# Patient Record
Sex: Female | Born: 1975 | Race: Black or African American | Hispanic: No | Marital: Married | Smoking: Never smoker
Health system: Southern US, Community
[De-identification: ages and names within clinical notes are randomized; demographics above are authoritative.]

## PROBLEM LIST (undated history)

## (undated) DIAGNOSIS — I10 Essential (primary) hypertension: Secondary | ICD-10-CM

---

## 1997-06-28 ENCOUNTER — Inpatient Hospital Stay (HOSPITAL_COMMUNITY): Admission: AD | Admit: 1997-06-28 | Discharge: 1997-06-28 | Payer: Self-pay | Admitting: *Deleted

## 1997-09-14 ENCOUNTER — Other Ambulatory Visit: Admission: RE | Admit: 1997-09-14 | Discharge: 1997-09-14 | Payer: Self-pay | Admitting: Obstetrics and Gynecology

## 1998-03-02 ENCOUNTER — Observation Stay (HOSPITAL_COMMUNITY): Admission: AD | Admit: 1998-03-02 | Discharge: 1998-03-02 | Payer: Self-pay | Admitting: Obstetrics and Gynecology

## 1998-03-14 ENCOUNTER — Inpatient Hospital Stay (HOSPITAL_COMMUNITY): Admission: AD | Admit: 1998-03-14 | Discharge: 1998-03-14 | Payer: Self-pay | Admitting: Obstetrics and Gynecology

## 1998-03-25 ENCOUNTER — Inpatient Hospital Stay (HOSPITAL_COMMUNITY): Admission: AD | Admit: 1998-03-25 | Discharge: 1998-03-27 | Payer: Self-pay | Admitting: Obstetrics & Gynecology

## 1998-09-19 ENCOUNTER — Emergency Department (HOSPITAL_COMMUNITY): Admission: EM | Admit: 1998-09-19 | Discharge: 1998-09-19 | Payer: Self-pay | Admitting: Emergency Medicine

## 2000-03-24 ENCOUNTER — Emergency Department (HOSPITAL_COMMUNITY): Admission: EM | Admit: 2000-03-24 | Discharge: 2000-03-24 | Payer: Self-pay | Admitting: *Deleted

## 2000-08-09 ENCOUNTER — Emergency Department (HOSPITAL_COMMUNITY): Admission: EM | Admit: 2000-08-09 | Discharge: 2000-08-09 | Payer: Self-pay

## 2000-08-17 ENCOUNTER — Other Ambulatory Visit: Admission: RE | Admit: 2000-08-17 | Discharge: 2000-08-17 | Payer: Self-pay | Admitting: Obstetrics

## 2000-08-17 ENCOUNTER — Encounter (INDEPENDENT_AMBULATORY_CARE_PROVIDER_SITE_OTHER): Payer: Self-pay

## 2000-09-14 ENCOUNTER — Emergency Department (HOSPITAL_COMMUNITY): Admission: EM | Admit: 2000-09-14 | Discharge: 2000-09-14 | Payer: Self-pay | Admitting: Emergency Medicine

## 2000-11-23 ENCOUNTER — Encounter: Admission: RE | Admit: 2000-11-23 | Discharge: 2000-11-23 | Payer: Self-pay | Admitting: Obstetrics & Gynecology

## 2003-07-13 ENCOUNTER — Inpatient Hospital Stay (HOSPITAL_COMMUNITY): Admission: AD | Admit: 2003-07-13 | Discharge: 2003-07-13 | Payer: Self-pay | Admitting: Obstetrics and Gynecology

## 2003-07-20 ENCOUNTER — Inpatient Hospital Stay (HOSPITAL_COMMUNITY): Admission: AD | Admit: 2003-07-20 | Discharge: 2003-07-20 | Payer: Self-pay | Admitting: Obstetrics and Gynecology

## 2003-10-30 ENCOUNTER — Other Ambulatory Visit: Admission: RE | Admit: 2003-10-30 | Discharge: 2003-10-30 | Payer: Self-pay | Admitting: Obstetrics and Gynecology

## 2004-02-22 ENCOUNTER — Inpatient Hospital Stay (HOSPITAL_COMMUNITY): Admission: AD | Admit: 2004-02-22 | Discharge: 2004-02-22 | Payer: Self-pay | Admitting: Obstetrics and Gynecology

## 2004-03-02 ENCOUNTER — Ambulatory Visit (HOSPITAL_COMMUNITY): Admission: RE | Admit: 2004-03-02 | Discharge: 2004-03-02 | Payer: Self-pay | Admitting: Obstetrics and Gynecology

## 2004-04-04 ENCOUNTER — Inpatient Hospital Stay (HOSPITAL_COMMUNITY): Admission: AD | Admit: 2004-04-04 | Discharge: 2004-04-04 | Payer: Self-pay | Admitting: Obstetrics and Gynecology

## 2004-05-09 ENCOUNTER — Inpatient Hospital Stay (HOSPITAL_COMMUNITY): Admission: AD | Admit: 2004-05-09 | Discharge: 2004-05-11 | Payer: Self-pay | Admitting: Obstetrics and Gynecology

## 2004-05-10 ENCOUNTER — Encounter (INDEPENDENT_AMBULATORY_CARE_PROVIDER_SITE_OTHER): Payer: Self-pay | Admitting: Specialist

## 2008-07-02 ENCOUNTER — Inpatient Hospital Stay (HOSPITAL_COMMUNITY): Admission: AD | Admit: 2008-07-02 | Discharge: 2008-07-03 | Payer: Self-pay | Admitting: Obstetrics and Gynecology

## 2008-07-02 ENCOUNTER — Ambulatory Visit: Payer: Self-pay | Admitting: Physician Assistant

## 2009-01-03 ENCOUNTER — Inpatient Hospital Stay (HOSPITAL_COMMUNITY): Admission: AD | Admit: 2009-01-03 | Discharge: 2009-01-03 | Payer: Self-pay | Admitting: Obstetrics and Gynecology

## 2009-01-03 ENCOUNTER — Ambulatory Visit: Payer: Self-pay | Admitting: Physician Assistant

## 2010-04-05 ENCOUNTER — Emergency Department (HOSPITAL_COMMUNITY)
Admission: EM | Admit: 2010-04-05 | Discharge: 2010-04-06 | Disposition: A | Discharge status: Home / Self Care | Payer: Self-pay | Attending: Emergency Medicine | Admitting: Emergency Medicine

## 2010-04-05 ENCOUNTER — Emergency Department (HOSPITAL_COMMUNITY): Payer: Self-pay

## 2010-04-05 DIAGNOSIS — I10 Essential (primary) hypertension: Secondary | ICD-10-CM | POA: Insufficient documentation

## 2010-04-05 DIAGNOSIS — R51 Headache: Secondary | ICD-10-CM | POA: Insufficient documentation

## 2010-04-05 LAB — DIFFERENTIAL
Basophils Absolute: 0 10*3/uL (ref 0.0–0.1)
Basophils Relative: 1 % (ref 0–1)
Eosinophils Relative: 3 % (ref 0–5)
Monocytes Relative: 8 % (ref 3–12)
Neutro Abs: 2.9 10*3/uL (ref 1.7–7.7)
Neutrophils Relative %: 44 % (ref 43–77)

## 2010-04-05 LAB — COMPREHENSIVE METABOLIC PANEL
ALT: 14 U/L (ref 0–35)
AST: 17 U/L (ref 0–37)
GFR calc non Af Amer: >60 mL/min (ref >60)
Glucose, Bld: 109 mg/dL — ABNORMAL HIGH (ref 70–99)
Potassium: 3.9 mEq/L (ref 3.5–5.1)
Sodium: 135 mEq/L (ref 135–145)
Total Protein: 7.6 g/dL (ref 6.0–8.3)

## 2010-04-05 LAB — POCT PREGNANCY, URINE: Preg Test, Ur: NEGATIVE

## 2010-04-05 LAB — CBC
Hemoglobin: 12.4 g/dL (ref 12.0–15.0)
MCH: 28.9 pg (ref 26.0–34.0)
MCHC: 32.6 g/dL (ref 30.0–36.0)
MCV: 88.6 fL (ref 78.0–100.0)

## 2010-04-06 LAB — URINALYSIS, ROUTINE W REFLEX MICROSCOPIC
Ketones, ur: NEGATIVE mg/dL
Protein, ur: NEGATIVE mg/dL
Specific Gravity, Urine: 1.019 (ref 1.005–1.030)
pH: 6 (ref 5.0–8.0)

## 2010-04-06 LAB — URINE MICROSCOPIC-ADD ON

## 2010-04-08 LAB — URINE CULTURE: Culture  Setup Time: 201203070911

## 2010-05-09 LAB — GC/CHLAMYDIA PROBE AMP, GENITAL: GC Probe Amp, Genital: NEGATIVE

## 2010-05-09 LAB — URINALYSIS, ROUTINE W REFLEX MICROSCOPIC
Glucose, UA: NEGATIVE mg/dL
Ketones, ur: NEGATIVE mg/dL
Leukocytes, UA: NEGATIVE
Nitrite: POSITIVE — AB
Urobilinogen, UA: 0.2 mg/dL (ref 0.0–1.0)

## 2010-05-09 LAB — RAPID URINE DRUG SCREEN, HOSP PERFORMED
Amphetamines: NOT DETECTED
Benzodiazepines: NOT DETECTED
Cocaine: NOT DETECTED

## 2010-05-09 LAB — URINE MICROSCOPIC-ADD ON

## 2010-05-09 LAB — CBC
Hemoglobin: 12.1 g/dL (ref 12.0–15.0)
MCHC: 33.9 g/dL (ref 30.0–36.0)
RBC: 3.83 MIL/uL — ABNORMAL LOW (ref 3.87–5.11)
RDW: 12.5 % (ref 11.5–15.5)

## 2010-05-09 LAB — COMPREHENSIVE METABOLIC PANEL
AST: 17 U/L (ref 0–37)
Alkaline Phosphatase: 40 U/L (ref 39–117)
BUN: 7 mg/dL (ref 6–23)
Calcium: 9.3 mg/dL (ref 8.4–10.5)
GFR calc Af Amer: >60 mL/min (ref >60)
GFR calc non Af Amer: >60 mL/min (ref >60)

## 2010-05-09 LAB — URINE CULTURE

## 2010-05-09 LAB — WET PREP, GENITAL

## 2010-06-17 NOTE — Discharge Summary (Signed)
NAMECOHEN, DOLEMAN                 ACCOUNT NO.:  [id number]   MEDICAL RECORD NO.:  [id number]          PATIENT TYPE:  INP   LOCATION:  9129                          FACILITY:  [facility]   PHYSICIAN:  Naima A. Dillard, M.D. DATE OF BIRTH:  11/19/1975   DATE OF ADMISSION:  05/09/2004  DATE OF DISCHARGE:                                 DISCHARGE SUMMARY   ADMITTING DIAGNOSES:  1.  Intrauterine pregnancy at 37 and two-sevenths weeks.  2.  Chronic hypertension with increasing blood pressures.  3.  Favorable cervix.  4.  Positive group B strep.  5.  Desires tubal sterilization.   DISCHARGE DIAGNOSES:  1.  Term pregnancy.  2.  Chronic hypertension.  3.  Desired tubal sterilization.   PROCEDURES:  1.  Normal spontaneous vaginal birth.  2.  Postpartum bilateral tubal ligation.  3.  General anesthesia.   HOSPITAL COURSE:  Catherine Parsons is a 35 year old gravida 4 para 2-0-1-2 at 51 and  two-sevenths weeks who was admitted for induction on May 09, 2004  secondary to chronic hypertension with increasing blood pressures and  favorable cervix. Her history has been remarkable for:  1.  Positive ANA.  2.  History of rapid labor.  3.  History of chronic hypertension on Aldomet.  4.  Positive group B strep.  5.  Desired tubal sterilization.   On admission, cervix was 3-4 cm, her blood pressure was in the 150-160s over  90s, positive beta strep had been noted. She was admitted under Dr.  Debria Garret care. Artificial rupture of membranes was accomplished with clear  fluid noted. A scalp electrode was placed. PIH labs were within normal  limits. Urine protein was negative. She then progressed rapidly to delivery  at 9:26 p.m. on the evening of May 09, 2004. She had a viable female by  the name of Catherine Parsons, weight 8 pounds, Apgars were 8 and 9. There were no  lacerations. The patient tolerated the procedure well. She did elect a tubal  sterilization and this was planned for the following day. During  the night  she did have one episode of moderate to heavy bleeding. Pitocin was added to  the IV. This then resolved itself. By postpartum day #1 the patient was  doing well. She was awaiting a tubal at 12 p.m. She was bottle feeding. Her  blood pressure over the night had been 143/94 and 150/103. By the morning of  April 11 it was 128/83. Hemoglobin was 9.1, down from 10.6, white blood cell  count 11.6, platelet count was 268. Her physical exam was within normal  limits. Her fundus was firm and her bleeding was scant to moderate. She was  taken to the operating room where Dr. Osborn Coho on May 10, 2004  performed a tubal sterilization per the patient's request under general  anesthesia. The patient tolerated the procedure well, there was minimal  blood loss, and the patient was sent to recovery in good condition. By  postpartum day #2, postoperative day #1, the patient was continuing to do  well. She had had an isolated event of  elevated blood pressure during the  night. She was begun on Procardia XL 30 mg. Her initial blood pressure prior  to the Procardia was 176/104 at 3:45 a.m. Subsequent blood pressures had  been in the 120s to 130s over 80 to 85. By the morning of May 11, 2004 she  was feeling well. She was up ad lib, tolerating a regular diet. She was  deemed to have received the full benefit of her hospital stay. Her incision  was clean, dry, and intact and she was discharged home.   DISCHARGE INSTRUCTIONS:  Per [facility] handout. PIH precautions  were reviewed.   DISCHARGE MEDICATIONS:  1.  Motrin 600 mg p.o. q.6h. p.r.n. pain.  2.  Tylox one to two p.o. q.3-4h. p.r.n. pain.  3.  Procardia XL 30 mg one p.o. daily with the patient instructed to take in      the evening since that is when it was initiated.   DISCHARGE FOLLOW-UP:  Will occur in 1 week at [facility] for blood  pressure check and p.r.n. at 6 weeks.      VLL/MEDQ  D:  05/11/2004  T:   05/11/2004  Job:  [id number]

## 2010-06-17 NOTE — Op Note (Signed)
Catherine Parsons, Catherine Parsons                 ACCOUNT NO.:  [id number]   MEDICAL RECORD NO.:  [id number]          PATIENT TYPE:  INP   LOCATION:  9129                          FACILITY:  [facility]   PHYSICIAN:  Osborn Coho, M.D.   DATE OF BIRTH:  1975/06/16   DATE OF PROCEDURE:  05/10/2004  DATE OF DISCHARGE:                                 OPERATIVE REPORT   PREOPERATIVE DIAGNOSES:  1.  Multiparity.  2.  Permanent sterilization.  3.  Status post normal spontaneous vaginal delivery.   POSTOPERATIVE DIAGNOSES:  1.  Multiparity.  2.  Permanent sterilization.  3.  Status post normal spontaneous vaginal delivery.   PROCEDURE:  Postpartum bilateral tubal ligation.   ANESTHESIA:  General.   ATTENDING:  Osborn Coho, M.D.   FLUIDS:  1600 mL.   URINE OUTPUT:  Voided prior to procedure.   ESTIMATED BLOOD LOSS:  Minimal, less than 50 mL.   COMPLICATIONS:  None.   PATHOLOGY:  Bilateral portions of fallopian tubes.   PROCEDURE:  The patient was taken to the operating room after the risks,  benefits, and alternatives reviewed with the patient, patient verbalized  understanding and consent signed and witnessed.  The patient was placed  under general anesthesia and prepped and draped in the normal sterile  fashion.  An approximately 10 mm incision was made at the umbilicus and  carried down to the underlying layer of fascia, which was then excised with  the Mayo scissors.  The peritoneum was then entered bluntly and the left  fallopian tube identified and carried out to its fimbriated end.  The tube  was grasped in its isthmic portion and ligated x2 with 0 plain.  The tube  was then excised and sent to pathology.  The remaining stump was cauterized  with the Bovie.  The same was done with the right fallopian tube after  carrying out to its fimbriated end.  The left fallopian tube was visualized  once again and  hemostasis noted.  The fascia was repaired with 0 Vicryl in a running  fashion.   The skin was closed with 3-0 Monocryl using a subcuticular stitch.  Sponge, lap and needle count was correct.  The patient tolerated the  procedure well and was returned to the recovery room in good condition.      AR/MEDQ  D:  05/10/2004  T:  05/10/2004  Job:  [id number]

## 2010-06-17 NOTE — H&P (Signed)
Catherine Parsons, Catherine Parsons                 ACCOUNT NO.:  [id number]   MEDICAL RECORD NO.:  [id number]          PATIENT TYPE:  INP   LOCATION:  9163                          FACILITY:  [facility]   PHYSICIAN:  Janine Limbo, M.D.DATE OF BIRTH:  09-06-1975   DATE OF ADMISSION:  05/09/2004  DATE OF DISCHARGE:                                HISTORY & PHYSICAL   Catherine Parsons is a 35 year old single black female, gravida 4, para 2-0-1-2, at  37-2/7 weeks who is being admitted for induction of labor secondary to  increasing blood pressures at term with favorable cervix.  She has a history  of chronic hypertension and has been on Aldomet 500 mg p.o. t.i.d.  At her  routine visit today, was noted to have increasing blood pressure and a  favorable cervix.  She denies any headaches, nausea, vomiting, or visual  disturbances.  Her most recent ultrasound on April 25, 2004, showed an  estimated fetal weight of 6 pounds 9 ounces.  Her pregnancy has been  followed at [facility] by the MD service and has been at risk  for 1) positive ANA, 2) history of rapid labor, 3) history of chronic  hypertension on Aldomet, now increasing, and 4) positive group B strep.   OB-GYN HISTORY:  She is a gravida 4, para 2-0-1-2, who delivered a viable  female infant in January 1995 who weighed 7 pounds 6 ounces at [redacted] weeks  gestation following a 4-hour labor without complications.  In February 2000,  she delivered a viable female infant who weighed 7 pounds 8 ounces at [redacted]  weeks gestation following a 4-hour labor, and she did have postpartum  hemorrhage with that delivery.  In June 2005, she had a miscarriage without  complications.   ALLERGIES:  no known drug allergies.   PAST MEDICAL HISTORY:  She reports having the usual childhood diseases.  She  reports her only surgery was wisdom teeth, and she has no other medical  issues.   FAMILY HISTORY:  Significant for mother and grandmother with chronic  hypertension,  maternal grandmother with diabetes.  Genetic history is  negative.   SOCIAL HISTORY:  She is single.  The father of the baby is [place],  and he is involved and supportive.  She is full-time homemaker.  He is  employed full time.  They deny any religious affiliation that might affect  their care.   PRENATAL LABORATORY DATA:  Blood type O positive.  Antibody negative.  Sickle cell trait negative.  Syphilis nonreactive.  Rubella positive.  Hepatitis B surface antigen is negative.  HIV is nonreactive.  GC and  chlamydia are both negative.  Her one-hour Glucola was within normal limits.   PHYSICAL EXAMINATION:  VITAL SIGNS:  Blood pressure 150s to 160s/90s.  She  is afebrile.  HEENT:  Grossly within normal limits.  HEART:  Regular rhythm and rate.  CHEST:  Clear.  BREASTS:  Soft and nontender.  ABDOMEN:  Gravid with irregular, mild uterine contractions.  Fetal heart  rate is in the 150s.  PELVIC:  Cervix is 3  to 4 cm per Dr. Su Hilt at the office.  EXTREMITIES:  Within normal limits.   ASSESSMENT:  1.  Intrauterine pregnancy at term.  2.  Chronic hypertension with increasing blood pressure.  3.  Favorable cervix.  4.  Positive group b Streptococcus.   PLAN:  Per consult with Dr. Leonard Schwartz, admit for induction of  labor today, and the patient is in agreement.  Will plan direct admission to  labor and delivery per Dr. Stefano Gaul.      SJD/MEDQ  D:  05/09/2004  T:  05/09/2004  Job:  [id number]

## 2011-08-07 ENCOUNTER — Emergency Department (HOSPITAL_COMMUNITY)
Admission: EM | Admit: 2011-08-07 | Discharge: 2011-08-08 | Disposition: A | Discharge status: Home / Self Care | Payer: Self-pay | Attending: Emergency Medicine | Admitting: Emergency Medicine

## 2011-08-07 ENCOUNTER — Emergency Department (HOSPITAL_COMMUNITY): Payer: Self-pay

## 2011-08-07 ENCOUNTER — Encounter (HOSPITAL_COMMUNITY): Payer: Self-pay | Admitting: *Deleted

## 2011-08-07 DIAGNOSIS — I10 Essential (primary) hypertension: Secondary | ICD-10-CM | POA: Insufficient documentation

## 2011-08-07 DIAGNOSIS — M7989 Other specified soft tissue disorders: Secondary | ICD-10-CM | POA: Insufficient documentation

## 2011-08-07 DIAGNOSIS — M25569 Pain in unspecified knee: Secondary | ICD-10-CM | POA: Insufficient documentation

## 2011-08-07 NOTE — ED Notes (Signed)
Pt states that she has high BP but that she has not taken her medications "in awhile". Pt states that she feels this may be why her leg is swollen.

## 2011-08-07 NOTE — ED Notes (Signed)
Rt knee pain isnce Saturday.  No known injury

## 2011-08-07 NOTE — ED Notes (Signed)
NP notified of pt BP.  

## 2011-08-08 LAB — POCT I-STAT, CHEM 8
BUN: 11 mg/dL (ref 6–23)
Creatinine, Ser: 0.6 mg/dL (ref 0.50–1.10)
Potassium: 3.9 mEq/L (ref 3.5–5.1)
Sodium: 139 mEq/L (ref 135–145)

## 2011-08-08 MED ORDER — AMLODIPINE BESYLATE 5 MG PO TABS: 5.0000 mg | ORAL_TABLET | Freq: Once | ORAL | Status: DC

## 2011-08-08 MED ORDER — AMLODIPINE BESYLATE 5 MG PO TABS
5.0000 mg | ORAL_TABLET | Freq: Once | ORAL | Status: AC
  Administered 2011-08-08: 5 mg via ORAL
  Filled 2011-08-08: qty 1

## 2011-08-08 NOTE — ED Notes (Signed)
CDU RN called ok to send pt over to her dept.

## 2011-08-08 NOTE — ED Provider Notes (Signed)
History     CSN: [id number]  Arrival date & time 08/07/11  2225   None     Chief Complaint  Patient presents with  . Knee Pain    (Consider location/radiation/quality/duration/timing/severity/associated sxs/prior treatment) HPI Comments: States she's had right knee discomfort for the past 2, days.  She does not recall any injury, twisting falling.  Previous injury to the knee.  She states it feels swollen in the posterior knee, behind her kneecap chest not taking any over-the-counter medications it is also noted that she is quite hypertensive.  On arrival in the emergency department.  On questioning her further she stopped taking her blood pressure medicine.  Several months ago, because she ran out, and she didn't find a new primary care Dr. unfortunately, she cannot remember what medication.  She was taking.  Patient is a 36 y.o. female presenting with knee pain. The history is provided by the patient.  Knee Pain This is a new problem. The current episode started in the past 7 days. The problem has been unchanged. Pertinent negatives include no headaches, joint swelling, numbness or rash.    History reviewed. No pertinent past medical history.  History reviewed. No pertinent past surgical history.  No family history on file.  History  Substance Use Topics  . Smoking status: Never Smoker   . Smokeless tobacco: Not on file  . Alcohol Use: No    OB History    Grav Para Term Preterm Abortions TAB SAB Ect Mult Living                  Review of Systems  Eyes: Negative for visual disturbance.  Cardiovascular: Positive for leg swelling.  Musculoskeletal: Negative for joint swelling.  Skin: Negative for rash and wound.  Neurological: Negative for dizziness, numbness and headaches.    Allergies  Review of patient's allergies indicates no known allergies.  Home Medications   Current Outpatient Rx  Name Route Sig Dispense Refill  . NAPROXEN SODIUM 220 MG PO TABS Oral Take  220 mg by mouth daily as needed. For pain    . AMLODIPINE BESYLATE 5 MG PO TABS Oral Take 1 tablet (5 mg total) by mouth once. 60 tablet 0    BP 170/101  Pulse 88  Temp 98.9 F (37.2 C) (Oral)  Resp 18  SpO2 99%  Physical Exam  Constitutional: She appears well-developed and well-nourished.  HENT:  Head: Normocephalic.  Eyes: Pupils are equal, round, and reactive to light.  Neck: Normal range of motion.  Cardiovascular: Normal rate.   Pulmonary/Chest: Effort normal.  Musculoskeletal: Normal range of motion.       Is minimal swelling to the medial right knee.  Negative Homans sign.  Strong pedal pulses, color, and temperature is equivalent bilaterally, there and the skin, I cannot reproduce pain with manipulation.  Patient states it only hurts when she is ambulatory  Neurological: She is alert.  Skin: Skin is warm. No rash noted. No erythema.    ED Course  Procedures (including critical care time)  Labs Reviewed  POCT I-STAT, CHEM 8 - Abnormal; Notable for the following:    Calcium, Ion 1.52 (*)     Hemoglobin 11.9 (*)     HCT 35.0 (*)     All other components within normal limits   Dg Knee Complete 4 Views Right  08/08/2011  *RADIOLOGY REPORT*  Clinical Data: Pain  RIGHT KNEE - COMPLETE 4+ VIEW  Comparison: None.  Findings: No acute fracture and  no dislocation.  Small joint effusion.  IMPRESSION: No acute bony injury.  Small joint effusion.  Original Report Authenticated By: Donavan Burnet, M.D.     1. Knee pain   2. Hypertension     ED ECG REPORT   Date: 08/08/2011  EKG Time: 3:14 AM  Rate: 62  Rhythm: normal sinus rhythm,  there are no previous tracings available for comparison  Axis: normal  Intervals:none  ST&T Change: none  Narrative Interpretation: normal            MDM   Will place patient in knee sleeve for comfort advise her to continue taking her Naprosyn on a regular basis.  I will evaluate EKG, and I stat for end organ damage to to her  persistently untreated, hypertension EKG and I-sat are normal will DC patient home with Rx for Norvacs adn referral to establish care with [facility]         Arman Filter, NP 08/08/11 7829  Arman Filter, NP 08/08/11 5621  Arman Filter, NP 08/08/11 [phone]

## 2011-08-08 NOTE — ED Notes (Signed)
12 lead EKG completed by this Clinical research associate and given to Sharen Hones NP

## 2011-08-08 NOTE — ED Provider Notes (Signed)
Medical screening examination/treatment/procedure(s) were performed by non-physician practitioner and as supervising physician I was immediately available for consultation/collaboration.    Vida Roller, MD 08/08/11 [phone]

## 2013-03-07 ENCOUNTER — Encounter (HOSPITAL_COMMUNITY): Payer: Self-pay | Admitting: Emergency Medicine

## 2013-03-07 ENCOUNTER — Emergency Department (HOSPITAL_COMMUNITY)
Admission: EM | Admit: 2013-03-07 | Discharge: 2013-03-08 | Disposition: A | Discharge status: Home / Self Care | Payer: 59 | Attending: Emergency Medicine | Admitting: Emergency Medicine

## 2013-03-07 DIAGNOSIS — J3489 Other specified disorders of nose and nasal sinuses: Secondary | ICD-10-CM | POA: Insufficient documentation

## 2013-03-07 DIAGNOSIS — I10 Essential (primary) hypertension: Secondary | ICD-10-CM

## 2013-03-07 DIAGNOSIS — R509 Fever, unspecified: Secondary | ICD-10-CM | POA: Insufficient documentation

## 2013-03-07 DIAGNOSIS — R000 Tachycardia, unspecified: Secondary | ICD-10-CM | POA: Insufficient documentation

## 2013-03-07 DIAGNOSIS — R52 Pain, unspecified: Secondary | ICD-10-CM | POA: Insufficient documentation

## 2013-03-07 DIAGNOSIS — IMO0001 Reserved for inherently not codable concepts without codable children: Secondary | ICD-10-CM | POA: Insufficient documentation

## 2013-03-07 DIAGNOSIS — Z79899 Other long term (current) drug therapy: Secondary | ICD-10-CM | POA: Insufficient documentation

## 2013-03-07 DIAGNOSIS — R6889 Other general symptoms and signs: Secondary | ICD-10-CM

## 2013-03-07 HISTORY — DX: Essential (primary) hypertension: I10

## 2013-03-07 MED ORDER — SODIUM CHLORIDE 0.9 % IV BOLUS (SEPSIS)
1000.0000 mL | Freq: Once | INTRAVENOUS | Status: AC
  Administered 2013-03-08: 1000 mL via INTRAVENOUS

## 2013-03-07 MED ORDER — AMLODIPINE BESYLATE 5 MG PO TABS
5.0000 mg | ORAL_TABLET | Freq: Once | ORAL | Status: AC
  Administered 2013-03-08: 5 mg via ORAL
  Filled 2013-03-07: qty 1

## 2013-03-07 NOTE — ED Provider Notes (Signed)
CSN: [id number]     Arrival date & time 03/07/13  2221 History   First MD Initiated Contact with Patient 03/07/13 2318     Chief Complaint  Patient presents with  . Generalized Body Aches  . Fever   (Consider location/radiation/quality/duration/timing/severity/associated sxs/prior Treatment) HPI Comments: Patient is a 38 year old female with a history of hypertension, currently off her blood pressure medication x8 months, who presents today for fever. Patient states that fever was preceded by nasal congestion and rhinorrhea this afternoon. She states that fever followed later in the day at 5:30PM. Tmax 102.79F. Patient took Aleve for symptoms with temporary relief of symptoms. She states she now feels "hot" and has diffuse myalgias. Myalgias most prominent across her upper back and in her b/l calves. She denies sore throat, cough, neck pain, chest pain, shortness of breath, abdominal pain, N/V, diarrhea, urinary symptoms, numbness/tingling, and weakness. Further denies hx of DVT/PE and ACS. 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   Patient is a 38 y.o. female presenting with fever. The history is provided by the patient. No language interpreter was used.  Fever Associated symptoms: congestion, myalgias and rhinorrhea   Associated symptoms: no chest pain, no cough, no diarrhea, no dysuria, no nausea, no rash, no sore throat and no vomiting     Past Medical History  Diagnosis Date  . Hypertension    History reviewed. No pertinent past surgical history. No family history on file. History  Substance Use Topics  . Smoking status: Never Smoker   . Smokeless tobacco: Not on file  . Alcohol Use: No   OB History   Grav Para Term Preterm Abortions TAB SAB Ect Mult Living                 Review of Systems  Constitutional: Positive for fever.  HENT: Positive for congestion and rhinorrhea. Negative for sore throat and trouble swallowing.   Respiratory: Negative  for cough and shortness of breath.   Cardiovascular: Negative for chest pain.  Gastrointestinal: Negative for nausea, vomiting and diarrhea.  Genitourinary: Negative for dysuria, hematuria, vaginal bleeding and vaginal discharge.  Musculoskeletal: Positive for myalgias.  Skin: Negative for rash.  Neurological: Negative for weakness and numbness.  All other systems reviewed and are negative.    Allergies  Review of patient's allergies indicates no known allergies.  Home Medications   Current Outpatient Rx  Name  Route  Sig  Dispense  Refill  . amLODipine (NORVASC) 10 MG tablet   Oral   Take 0.5 tablets (5 mg total) by mouth daily.   30 tablet   0   . oseltamivir (TAMIFLU) 75 MG capsule   Oral   Take 1 capsule (75 mg total) by mouth every 12 (twelve) hours.   10 capsule   0    BP 161/96  Pulse 109  Temp(Src) 100.4 F (38 C) (Rectal)  Resp 16  SpO2 100%  LMP 02/11/2013  Physical Exam  Nursing note and vitals reviewed. Constitutional: She is oriented to person, place, and time. She appears well-developed and well-nourished. No distress.  HENT:  Head: Normocephalic and atraumatic.  Right Ear: External ear normal. No mastoid tenderness.  Left Ear: External ear normal. No mastoid tenderness.  Nose: No rhinorrhea.  Mouth/Throat: Uvula is midline, oropharynx is clear and moist and mucous membranes are normal. No oropharyngeal exudate.  +Nasal congestion. Uvula midline and patient tolerating secretions without difficulty. Oropharynx clear.  Eyes: Conjunctivae and EOM are normal. Pupils are  equal, round, and reactive to light. No scleral icterus.  Neck: Normal range of motion. Neck supple.  No nuchal rigidity appreciated  Cardiovascular: Regular rhythm and normal heart sounds.  Tachycardia present.   HR 117bpm  Pulmonary/Chest: Effort normal and breath sounds normal. No stridor. No respiratory distress. She has no wheezes. She has no rales.  No tachypnea or dyspnea noted.  No retractions or accessory muscle use. Symmetric chest expansion.  Abdominal: Soft. She exhibits no distension. There is no tenderness. There is no rebound and no guarding.  Abdomen soft and nontender without peritoneal signs or masses.  Musculoskeletal: Normal range of motion.  Neurological: She is alert and oriented to person, place, and time.  Skin: Skin is warm and dry. No rash noted. She is not diaphoretic. No erythema. No pallor.  Psychiatric: She has a normal mood and affect. Her behavior is normal.    ED Course  Procedures (including critical care time) Labs Review Labs Reviewed - No data to display Imaging Review Dg Chest 2 View  03/08/2013   CLINICAL DATA:  38 year old female cough congestion and fever. Initial encounter.  EXAM: CHEST  2 VIEW  COMPARISON:  04/06/2010.  FINDINGS: Lung volumes are stable and within normal limits. Cardiac size is stable at the upper limits of normal. Other mediastinal contours are within normal limits. EKG leads and wires overlie the chest. The lungs are clear. No pneumothorax or effusion. No acute osseous abnormality identified.  IMPRESSION: No acute cardiopulmonary abnormality.   Electronically Signed   By: Augusto Gamble M.D.   On: 03/08/2013 01:00    EKG Interpretation    Date/Time:  Friday March 07 2013 22:31:23 EST Ventricular Rate:  131 PR Interval:  144 QRS Duration: 80 QT Interval:  298 QTC Calculation: 440 R Axis:   66 Text Interpretation:  Sinus tachycardia Otherwise normal ECG Since last tracing rate faster Confirmed by MILLER  MD, BRIAN ([id number]) on 03/08/2013 1:29:24 AM            MDM   1. Flu-like symptoms   2. Hypertension     Patient presents for fever in the presence of nasal congestion, rhinorrhea, and myalgias with onset this AM. Symptoms clinically c/w influenza like illness. Patient denies chest pain and shortness of breath; no N/V. She endorses improvement over ED course with IV fluids and Motrin. Patient tachycardic  on arrival; however, this is most likely secondary to fever and heartbeat trending down after patient given antipyretics. Doubt pulmonary embolism as patient without tachypnea, dyspnea, or hypoxia. Well's PE score 1.5 correlating with low risk of PE. Blood pressure trending down with Norvasc.  Patient hemodynamically stable and appropriate for discharge with prescription for Tamiflu. Will also prescribe Norvasc which patient has been noncompliant with secondary to lack of primary care followup. Supportive treatments advised and return precautions discussed. Patient agreeable to plan with no unaddressed concerns.   Filed Vitals:   03/08/13 0100 03/08/13 0200 03/08/13 0300 03/08/13 0302  BP: 164/106 158/105 140/89   Pulse: 110 106 110   Temp:    98.4 F (36.9 C)  TempSrc:    Oral  Resp: 17 19 18    SpO2: 100% 100% 99%      Antony Madura, PA-C 03/10/13 2000

## 2013-03-07 NOTE — ED Notes (Addendum)
C/o fever, body aches, and generalized weakness.  Denies nausea, vomiting, and diarrhea. HR elevated. States fever was 102.3 around 5:30 while at work. Took 1 Aleve at 7:30pm.  BP elevated- states she has not had blood pressure medication in approx 8 months due to no insurance at that time.

## 2013-03-08 ENCOUNTER — Emergency Department (HOSPITAL_COMMUNITY): Payer: 59

## 2013-03-08 MED ORDER — IBUPROFEN 800 MG PO TABS
800.0000 mg | ORAL_TABLET | Freq: Once | ORAL | Status: AC
  Administered 2013-03-08: 800 mg via ORAL
  Filled 2013-03-08: qty 1

## 2013-03-08 MED ORDER — AMLODIPINE BESYLATE 10 MG PO TABS: 5.0000 mg | ORAL_TABLET | Freq: Every day | ORAL | Status: DC

## 2013-03-08 MED ORDER — OSELTAMIVIR PHOSPHATE 75 MG PO CAPS: 75.0000 mg | ORAL_CAPSULE | Freq: Two times a day (BID) | ORAL | Status: DC

## 2013-03-08 MED ORDER — SODIUM CHLORIDE 0.9 % IV BOLUS (SEPSIS)
1000.0000 mL | Freq: Once | INTRAVENOUS | Status: AC
  Administered 2013-03-08: 1000 mL via INTRAVENOUS

## 2013-03-08 MED ORDER — OSELTAMIVIR PHOSPHATE 75 MG PO CAPS
75.0000 mg | ORAL_CAPSULE | Freq: Once | ORAL | Status: AC
  Administered 2013-03-08: 75 mg via ORAL
  Filled 2013-03-08: qty 1

## 2013-03-08 NOTE — Discharge Instructions (Signed)
Take Tamiflu as prescribed for symptoms. Also recommend you take ibuprofen 600 mg every 6 hours as needed for fever control and body aches. Use over-the-counter medications for your nasal congestion. You may also try saline sinus rinses. Followup with your primary care doctor regarding your symptoms. Return if symptoms worsen.  Influenza, Adult Influenza ("the flu") is a viral infection of the respiratory tract. It occurs more often in winter months because people spend more time in close contact with one another. Influenza can make you feel very sick. Influenza easily spreads from person to person (contagious). CAUSES  Influenza is caused by a virus that infects the respiratory tract. You can catch the virus by breathing in droplets from an infected person's cough or sneeze. You can also catch the virus by touching something that was recently contaminated with the virus and then touching your mouth, nose, or eyes. SYMPTOMS  Symptoms typically last 4 to 10 days and may include:  Fever.  Chills.  Headache, body aches, and muscle aches.  Sore throat.  Chest discomfort and cough.  Poor appetite.  Weakness or feeling tired.  Dizziness.  Nausea or vomiting. DIAGNOSIS  Diagnosis of influenza is often made based on your history and a physical exam. A nose or throat swab test can be done to confirm the diagnosis. RISKS AND COMPLICATIONS You may be at risk for a more severe case of influenza if you smoke cigarettes, have diabetes, have chronic heart disease (such as heart failure) or lung disease (such as asthma), or if you have a weakened immune system. Elderly people and pregnant women are also at risk for more serious infections. The most common complication of influenza is a lung infection (pneumonia). Sometimes, this complication can require emergency medical care and may be life-threatening. PREVENTION  An annual influenza vaccination (flu shot) is the best way to avoid getting influenza.  An annual flu shot is now routinely recommended for all adults in the [place] TREATMENT  In mild cases, influenza goes away on its own. Treatment is directed at relieving symptoms. For more severe cases, your caregiver may prescribe antiviral medicines to shorten the sickness. Antibiotic medicines are not effective, because the infection is caused by a virus, not by bacteria. HOME CARE INSTRUCTIONS  Only take over-the-counter or prescription medicines for pain, discomfort, or fever as directed by your caregiver.  Use a cool mist humidifier to make breathing easier.  Get plenty of rest until your temperature returns to normal. This usually takes 3 to 4 days.  Drink enough fluids to keep your urine clear or pale yellow.  Cover your mouth and nose when coughing or sneezing, and wash your hands well to avoid spreading the virus.  Stay home from work or school until your fever has been gone for at least 1 full day. SEEK MEDICAL CARE IF:   You have chest pain or a deep cough that worsens or produces more mucus.  You have nausea, vomiting, or diarrhea. SEEK IMMEDIATE MEDICAL CARE IF:   You have difficulty breathing, shortness of breath, or your skin or nails turn bluish.  You have severe neck pain or stiffness.  You have a severe headache, facial pain, or earache.  You have a worsening or recurring fever.  You have nausea or vomiting that cannot be controlled. MAKE SURE YOU:  Understand these instructions.  Will watch your condition.  Will get help right away if you are not doing well or get worse. Document Released: 01/14/2000 Document Revised: 07/18/2011 Document Reviewed:  04/17/2011 ExitCare Patient Information 2014 [place], [place].

## 2013-03-11 NOTE — ED Provider Notes (Signed)
Medical screening examination/treatment/procedure(s) were performed by non-physician practitioner and as supervising physician I was immediately available for consultation/collaboration.    Vida RollerBrian D Margarett Viti, MD 03/11/13 [phone]

## 2013-04-10 ENCOUNTER — Other Ambulatory Visit: Payer: Self-pay | Admitting: Physician Assistant

## 2013-04-10 ENCOUNTER — Other Ambulatory Visit (HOSPITAL_COMMUNITY)
Admission: RE | Admit: 2013-04-10 | Discharge: 2013-04-10 | Disposition: A | Discharge status: Home / Self Care | Payer: 59 | Source: Ambulatory Visit | Attending: Family Medicine | Admitting: Family Medicine

## 2013-04-10 DIAGNOSIS — Z124 Encounter for screening for malignant neoplasm of cervix: Secondary | ICD-10-CM | POA: Insufficient documentation

## 2013-04-29 ENCOUNTER — Other Ambulatory Visit: Payer: Self-pay | Admitting: Physician Assistant

## 2013-04-29 DIAGNOSIS — R748 Abnormal levels of other serum enzymes: Secondary | ICD-10-CM

## 2013-05-07 ENCOUNTER — Ambulatory Visit
Admission: RE | Admit: 2013-05-07 | Discharge: 2013-05-07 | Disposition: A | Discharge status: Home / Self Care | Payer: 59 | Source: Ambulatory Visit | Attending: Physician Assistant | Admitting: Physician Assistant

## 2013-05-07 DIAGNOSIS — R748 Abnormal levels of other serum enzymes: Secondary | ICD-10-CM

## 2013-10-24 ENCOUNTER — Encounter (HOSPITAL_COMMUNITY): Payer: Self-pay | Admitting: Emergency Medicine

## 2013-10-24 ENCOUNTER — Emergency Department (HOSPITAL_COMMUNITY)
Admission: EM | Admit: 2013-10-24 | Discharge: 2013-10-24 | Disposition: A | Discharge status: Home / Self Care | Payer: 59 | Attending: Emergency Medicine | Admitting: Emergency Medicine

## 2013-10-24 DIAGNOSIS — R51 Headache: Secondary | ICD-10-CM | POA: Insufficient documentation

## 2013-10-24 DIAGNOSIS — R519 Headache, unspecified: Secondary | ICD-10-CM

## 2013-10-24 DIAGNOSIS — Z79899 Other long term (current) drug therapy: Secondary | ICD-10-CM | POA: Insufficient documentation

## 2013-10-24 DIAGNOSIS — I10 Essential (primary) hypertension: Secondary | ICD-10-CM | POA: Insufficient documentation

## 2013-10-24 MED ORDER — DEXAMETHASONE SODIUM PHOSPHATE 10 MG/ML IJ SOLN
10.0000 mg | Freq: Once | INTRAMUSCULAR | Status: AC
  Administered 2013-10-24: 10 mg via INTRAVENOUS
  Filled 2013-10-24: qty 1

## 2013-10-24 MED ORDER — SODIUM CHLORIDE 0.9 % IV BOLUS (SEPSIS)
1000.0000 mL | Freq: Once | INTRAVENOUS | Status: AC
  Administered 2013-10-24: 1000 mL via INTRAVENOUS

## 2013-10-24 MED ORDER — DIPHENHYDRAMINE HCL 50 MG/ML IJ SOLN
12.5000 mg | Freq: Once | INTRAMUSCULAR | Status: AC
  Administered 2013-10-24: 12.5 mg via INTRAVENOUS
  Filled 2013-10-24: qty 1

## 2013-10-24 MED ORDER — PROCHLORPERAZINE EDISYLATE 5 MG/ML IJ SOLN
10.0000 mg | Freq: Four times a day (QID) | INTRAMUSCULAR | Status: DC | PRN
  Administered 2013-10-24: 10 mg via INTRAVENOUS
  Filled 2013-10-24: qty 2

## 2013-10-24 NOTE — ED Provider Notes (Signed)
CSN: [id number]     Arrival date & time 10/24/13  1834 History   First MD Initiated Contact with Patient 10/24/13 2013     Chief Complaint  Patient presents with  . Headache     (Consider location/radiation/quality/duration/timing/severity/associated sxs/prior Treatment) Patient is a 38 y.o. female presenting with headaches. The history is provided by the patient.  Headache Pain location:  R temporal Quality:  Sharp Radiates to:  Does not radiate Severity currently:  Unable to specify Severity at highest:  Unable to specify Onset quality:  Gradual Duration:  1 day Timing:  Constant Progression:  Waxing and waning Chronicity:  Recurrent (hx of similar HAs, she states she gets them sometimes when her BP is up or on her period. LMP 1 mo ago) Similar to prior headaches: yes   Context: not activity, not exposure to bright light and not loud noise   Relieved by: APAP. Worsened by:  Nothing tried Associated symptoms: no abdominal pain, no back pain, no blurred vision, no congestion, no cough, no diarrhea, no dizziness, no fatigue, no fever, no nausea, no near-syncope, no neck stiffness, no numbness, no photophobia, no syncope and no vomiting     Past Medical History  Diagnosis Date  . Hypertension    History reviewed. No pertinent past surgical history. No family history on file. History  Substance Use Topics  . Smoking status: Never Smoker   . Smokeless tobacco: Not on file  . Alcohol Use: No   OB History   Grav Para Term Preterm Abortions TAB SAB Ect Mult Living                 Review of Systems  Constitutional: Negative for fever, activity change, appetite change and fatigue.  HENT: Negative for congestion and rhinorrhea.   Eyes: Negative for blurred vision, photophobia, discharge, redness and itching.  Respiratory: Negative for cough, shortness of breath and wheezing.   Cardiovascular: Negative for chest pain, syncope and near-syncope.  Gastrointestinal: Negative for  nausea, vomiting, abdominal pain and diarrhea.  Genitourinary: Negative for dysuria and hematuria.  Musculoskeletal: Negative for back pain and neck stiffness.  Skin: Negative for rash and wound.  Neurological: Positive for headaches. Negative for dizziness, syncope and numbness.      Allergies  Review of patient's allergies indicates no known allergies.  Home Medications   Prior to Admission medications   Medication Sig Start Date End Date Taking? Authorizing Provider  acetaminophen (TYLENOL) 500 MG tablet Take 500 mg by mouth 2 (two) times daily as needed (pain).   Yes Historical Provider, MD  amLODipine (NORVASC) 10 MG tablet Take 10 mg by mouth daily.   Yes Historical Provider, MD  lisinopril (PRINIVIL,ZESTRIL) 10 MG tablet Take 10 mg by mouth at bedtime.  10/24/13  Yes Historical Provider, MD   BP 126/93  Pulse 64  Temp(Src) 98.3 F (36.8 C)  Resp 18  Ht  (1.651 m)  Wt 169 lb (76.658 kg)  BMI 28.12 kg/m2  SpO2 100%  LMP 09/26/2013 Physical Exam  Constitutional: She is oriented to person, place, and time. She appears well-developed and well-nourished. No distress.  HENT:  Head: Normocephalic and atraumatic.  Mouth/Throat: Oropharynx is clear and moist.  Eyes: Conjunctivae and EOM are normal. Pupils are equal, round, and reactive to light. Right eye exhibits no discharge. Left eye exhibits no discharge. No scleral icterus.  Neck: Normal range of motion. Neck supple.  Cardiovascular: Normal rate, regular rhythm and intact distal pulses.  Exam reveals no  gallop and no friction rub.   No murmur heard. Pulmonary/Chest: Effort normal and breath sounds normal. No respiratory distress. She has no wheezes. She has no rales.  Abdominal: Soft. She exhibits no distension and no mass. There is no tenderness.  Musculoskeletal: Normal range of motion.  Neurological: She is alert and oriented to person, place, and time. No cranial nerve deficit. She exhibits normal muscle tone.  Coordination normal.  CN II-XII intact, 5/5 strenght in all exts, normal sensation in all exts, F2N negative, ambulatory without ataxia  Skin: She is not diaphoretic.    ED Course  Procedures (including critical care time) Labs Review Labs Reviewed - No data to display  Imaging Review No results found.   EKG Interpretation None      MDM   MDM: 38 y.o. AAF w/ PMHx of HTN, w/ cc: HA. Pt states HA for a little over a day. States hurts on R. No photo/phonophobia. No n/v/d. States has had similar HAs past. Feels related to BP but doesn't normally check it. States has had normally with period. LMP 26 days ago, no active vag bleeding. Normally takes APAP for HA. No numbness, weakness. AFVSS, well appearing,. No neck rigidity, no fever, unlikely meningitis. No neuro deficits, BP not excessively elevated, HA c/w prior, unlikely SAH or other ICH. Pt given migraine cocktail, fluids, HA improved. Offered more meds or to go home and sleep it off and she elected to sleep it off. Discharged. Likely benign HA. Possibly related to her menstruation. Similar to prior, no concerning sxs. Discharged.   Final diagnoses:  Acute nonintractable headache, unspecified headache type   Discharge Medication List as of 10/24/2013 10:10 PM     [facility] EMERGENCY DEPARTMENT [address] [id number] [place] [address] [phone]  As needed  Discharged  Pilar Jarvis, MD 10/24/13 [phone]

## 2013-10-24 NOTE — ED Provider Notes (Signed)
I saw and evaluated the patient, reviewed the resident's note and I agree with the findings and plan.   .Face to face Exam:  General:  Awake HEENT:  Atraumatic Resp:  Normal effort Abd:  Nondistended Neuro:No focal weakness  Juquan Reznick L Elivia Robotham, MD 10/24/13 2359 

## 2013-10-24 NOTE — Discharge Instructions (Signed)

## 2013-10-24 NOTE — ED Notes (Signed)
The pt woke up this am with a terrible headache.  No nv.   She has taken tylenol that took the headache away but it came back.  lmp  Aug 20th

## 2013-10-24 NOTE — ED Notes (Signed)
Pt states "I can walk" rather than using wheelchair.

## 2013-11-11 ENCOUNTER — Ambulatory Visit (INDEPENDENT_AMBULATORY_CARE_PROVIDER_SITE_OTHER): Payer: 59 | Admitting: Internal Medicine

## 2013-11-11 ENCOUNTER — Encounter: Payer: Self-pay | Admitting: Internal Medicine

## 2013-11-11 DIAGNOSIS — R946 Abnormal results of thyroid function studies: Secondary | ICD-10-CM

## 2013-11-11 DIAGNOSIS — R7989 Other specified abnormal findings of blood chemistry: Secondary | ICD-10-CM

## 2013-11-11 NOTE — Patient Instructions (Signed)
Please stop at the lab. Please come back for a follow-up appointment in 3 months.  I will let you know if we need to check the urine collection now. Patient information (Up-to-Date): Collection of a 24-hour urine specimen  - You should collect every drop of urine during each 24-hour period. It does not matter how much or little urine is passed each time, as long as every drop is collected. - Begin the urine collection in the morning after you wake up, after you have emptied your bladder for the first time. - Urinate (empty the bladder) for the first time and flush it down the toilet. Note the exact time (eg, 6:15 AM). You will begin the urine collection at this time. - Collect every drop of urine during the day and night in an empty collection bottle. Store the bottle at room temperature or in the refrigerator. - If you need to have a bowel movement, any urine passed with the bowel movement should be collected. Try not to include feces with the urine collection. If feces does get mixed in, do not try to remove the feces from the urine collection bottle. - Finish by collecting the first urine passed the next morning, adding it to the collection bottle. This should be within ten minutes before or after the time of the first morning void on the first day (which was flushed). In this example, you would try to void between 6:05 and 6:25 on the second day. - If you need to urinate one hour before the final collection time, drink a full glass of water so that you can void again at the appropriate time. If you have to urinate 20 minutes before, try to hold the urine until the proper time. - Please note the exact time of the final collection, even if it is not the same time as when collection began on day 1. - The bottle(s) may be kept at room temperature for a day or two, but should be kept cool or refrigerated for longer periods of time.  Hypercalcemia Hypercalcemia means the calcium in your blood is too  high. Calcium in our blood is important for the control of many things, such as:  Blood clotting.  Conducting of nerve impulses.  Muscle contraction.  Maintaining teeth and bone health.  Other body functions. In the bloodstream, calcium maintains a constant balance with another mineral, phosphate. Calcium is absorbed into the body through the small intestine. This is helped by vitamin D. Calcium levels are maintained mostly by vitamin D and a hormone (parathyroid hormone). But the kidneys also help. Hypercalcemia can happen when the concentration of calcium is too high for the kidneys to maintain balance. The body maintains a balance between the calcium we eat and the calcium already in our body. If calcium intake is increased or we cannot use calcium properly, there may be problems. Some common sources of calcium are:   Dairy products.  Nuts.  Eggs.  Whole grains.  Legumes.  Green leafy vegetables. CAUSES There are many causes of this condition, but some common ones are:  Hyperparathyroidism. This is an overactivity of the parathyroid gland.  Cancers of the breast, kidney, lung, head, and neck are common causes of calcium increases.  Medications that cause you to urinate more often (diuretics), nausea, vomiting, and diarrhea also increase the calcium in the blood.  Overuse of calcium-containing antacids. SYMPTOMS  Many patients with mild hypercalcemia have no symptoms. For those with symptoms, common problems include:  Loss  of appetite.  Constipation.  Increased thirst.  Heart rhythm changes.  Abnormal thinking.  Nausea.  Abdominal pain.  Kidney stones.  Mood swings.  Coma and death when severe.  Vomiting.  Increased urination.  High blood pressure.  Confusion. DIAGNOSIS   Your caregiver will do a medical history and perform a physical exam on you.  Calcium and parathyroid hormone (PTH) may be measured with a blood test. TREATMENT   The  treatment depends on the calcium level and what is causing the higher level. Hypercalcemia can be life threatening. Fast lowering of the calcium level may be necessary.  With normal kidney function, fluids can be given by vein to clear the excess calcium. Hemodialysis works well to reduce dangerous calcium levels if there is poor kidney function. This is a procedure in which a machine is used to filter out unwanted substances. The blood is then returned to the body.  Drugs, such as diuretics, can be given after adequate fluid intake is established. These medications help the kidneys get rid of extra calcium. Drugs that lessen (inhibit) bone loss are helpful in gaining long-term control. Phosphate pills help lower high calcium levels caused by a low supply of phosphate. Anti-inflammatory agents such as steroids are helpful with some cancers and toxic levels of vitamin D.  Treatment of the underlying cause of the hypercalcemia will also correct the imbalance. Hyperparathyroidism is usually treated by surgical removal of one or more of the parathyroid glands and any tissue, other than the glands themselves, that is producing too much hormone.  The hypercalcemia caused by cancer is difficult to treat without controlling the cancer. Symptoms can be improved with fluids and drug therapy as outlined above. PROGNOSIS   Surgery to remove the parathyroid glands is usually successful. This also depends on the amount of damage to the kidneys and whether or not it can be treated.  Mild hypercalcemia can be controlled with good fluid intake and the use of effective medications.  Hypercalcemia often develops as a late complication of cancer. The expected outlook is poor without effective anticancer therapy. PREVENTION   If you are at risk for developing hypercalcemia, be familiar with early symptoms. Report these to your caregiver.  Good fluid intake (up to four quarts of liquid a day if possible) is  helpful.  Try to control nausea and vomiting, and treat fevers to avoid dehydration.  Lowering the amount of calcium in your diet is not necessary. High blood calcium reduces absorption of calcium in the intestine.  Stay as active as possible. SEEK IMMEDIATE MEDICAL CARE IF:   You develop chest pain, sweating, or shortness of breath.  You get confused, feel faint or pass out.  You develop severe nausea and vomiting. MAKE SURE YOU:   Understand these instructions.  Will watch your condition.  Will get help right away if you are not doing well or get worse. Document Released: 04/01/2004 Document Revised: 06/02/2013 Document Reviewed: 01/11/2010 [facility] Patient Information 2015 [place] Lake[employer], [place]. This information is not intended to replace advice given to you by your health care provider. Make sure you discuss any questions you have with your health care provider.

## 2013-11-11 NOTE — Progress Notes (Signed)
Patient ID: Catherine Parsons, female   DOB: 1975-10-06, 38 y.o.   MRN: [id number]   HPI  Catherine Parsons is a 38 y.o.-year-old female, referred by her PCP, REDMON,NOELLE, PA-C, in consultation for hypercalcemia/hyperparathyroidism and low TSH  Pt was dx with hypercalcemia in 03/2013. I reviewed pt's pertinent labs: 10/08/2013: PTH 68 10/02/2013: Ca 11.3 (8.6-10.3) 08/07/2013: iCa 1.52 (1.12-1.32) 04/12/2013: Ca 11.4 (8.6-10.3) 04/10/2013: Ca 11.1  Lab Results  Component Value Date   CALCIUM 10.5 04/05/2010   CALCIUM 9.3 07/02/2008   No fractures or falls.   No h/o kidney stones.  No h/o CKD. Last BUN/Cr: 12/0.65 on 03/12/2-15 Lab Results  Component Value Date   BUN 11 08/08/2011   CREATININE 0.60 08/08/2011   Pt is/was not on HCTZ.  No h/o vitamin D deficiency but no levels available.  Pt is not on calcium and vitamin D; she also eats dairy but not every day and some green, leafy, vegetables.  Pt does not have a FH of hypercalcemia, pituitary tumors, thyroid cancer, or osteoporosis.   Low TSH: 10/02/2013: TSH 0.26 08/25/2013: TSH 0.26  She denies: - palpitations - tremors - weight loss (c/o weigh gain) - heat intolerance - hyperdefecation - hair loss  + FH of thyroid pbs in mother. No FH of thyroid cancer. No radiation tx to head and neck.  No herbal supplement, iodine supplements, no recent CT scans, no steroids.   I reviewed her chart and she also has a history of HTN.   ROS: Constitutional: + weight gain, + fatigue, no subjective hyperthermia/hypothermia, + poor sleep Eyes: no blurry vision, no xerophthalmia ENT: no sore throat, no nodules palpated in throat, no dysphagia/odynophagia, no hoarseness Cardiovascular: no CP/SOB/palpitations/leg swelling Respiratory: no cough/SOB Gastrointestinal: no N/V/D/C Musculoskeletal: no muscle/joint aches Skin: no rashes Neurological: no tremors/numbness/tingling/dizziness, + HA Psychiatric: no depression/anxiety  Past Medical  History  Diagnosis Date  . Hypertension    No past surgical history on file.  History   Social History  . Marital Status: Single    Spouse Name: N/A    Number of Children: 3: 67, 26, 62 y/o   Occupational History  . childcare   Social History Main Topics  . Smoking status: former Smoker, quit 2009  . Smokeless tobacco: Not on file  . Alcohol Use: No  . Drug Use: No   Current Outpatient Rx  Name  Route  Sig  Dispense  Refill  . acetaminophen (TYLENOL) 500 MG tablet   Oral   Take 500 mg by mouth 2 (two) times daily as needed (pain).         Marland Kitchen lisinopril (PRINIVIL,ZESTRIL) 10 MG tablet   Oral   Take 10 mg by mouth at bedtime.          . metoprolol succinate (TOPROL-XL) 50 MG 24 hr tablet   Oral   Take 50 mg by mouth daily. Take with or immediately following a meal.          No Known Allergies  FH: - HTN in M, MGM - thyroid ds in M  PE: BP 126/78  Pulse 70  Temp(Src) 97.7 F (36.5 C)  Resp 14  Ht 5\' 5"  (1.651 m)  Wt 170 lb 3.2 oz (77.202 kg)  BMI 28.32 kg/m2  SpO2 99%  LMP 09/26/2013 Wt Readings from Last 3 Encounters:  11/11/13 170 lb 3.2 oz (77.202 kg)  10/24/13 169 lb (76.658 kg)   Constitutional: overweight, in NAD. No kyphosis. Eyes: PERRLA, EOMI, no exophthalmos  ENT: moist mucous membranes, no thyromegaly, no cervical lymphadenopathy Cardiovascular: RRR, No MRG Respiratory: CTA B Gastrointestinal: abdomen soft, NT, ND, BS+ Musculoskeletal: no deformities, strength intact in all 4 Skin: moist, warm, no rashes Neurological: no tremor with outstretched hands, DTR normal in all 4  Assessment: 1. Hypercalcemia/hyperparathyroidism  2. Low TSH - x2  Plan: Patient has had several instances of elevated calcium, with the highest level being at 11.3. A corresponding intact PTH level was also high, at 68 It is unclear whether she has vitamin D deficiency, however, it is very likely that she does have a parathyroid adenoma based on the high PTH  level with a borderline/high calcium.  No apparent complications from hypercalcemia: no h/o nephrolithiasis, no osteoporosis, no fractures. No abdominal pain, depression, bone pain. - I discussed with the patient about the physiology of calcium and parathyroid hormone, and possible side effects from increased PTH, including kidney stones, osteoporosis, abdominal pain, etc.  - We discussed that we need to check whether his hyperparathyroidism is primary (Familial hypercalcemic hypocalciuria or parathyroid adenoma) or secondary (to conditions like: vitamin D deficiency, calcium malabsorption, hypercalciuria, renal insufficiency, etc.). - we discussed about criteria for parathyroid surgery:  Increased calcium by more than 1 mg/dL above the upper limit of normal  Kidney ds.  Osteoporosis (or Vb fx) Age <953 years old New (2013): High UCa >400 mg/d and increased stone risk by biochemical stone risk analysis Presence of nephrolithiasis or nephrocalcinosis Pt's preference!  - she does meet 2 criteria - in bold above - I will also check: calcium level intact PTH ([facility]) Magnesium Phosphorus vitamin D- 25 HO and 1,25 HO 24h urinary calcium/creatinine ratio - if vit D normal - pt given instructions for urine collection and the jug - If the tests indicate a parathyroid adenoma, I will refer her to surgery. We discussed possible consequences of hyperparathyroidism: ~1/3 pts will develop complications over 15 years (OP, nephrolithiasis). I will wait for the results of the above labs and will discuss with the plan with the patient.  - I will see the patient back in 3 months  2. Low TSH - we discussed that she may have mild overactive thyroid. I do not have free thyroid hormone levels available, but today we will check: TSH, fT4 and fT3 - we discussed about possible signs of thyrotoxicosis, which she denies  Component     Latest Ref Rng 11/11/2013  Sodium     135 - 145 mEq/L 135  Potassium     3.5  - 5.1 mEq/L 4.3  Chloride     96 - 112 mEq/L 109  CO2     19 - 32 mEq/L 23  Glucose     70 - 99 mg/dL 85  BUN     6 - 23 mg/dL 15  Creatinine     0.4 - 1.2 mg/dL 0.8  Calcium     8.4 - 10.5 mg/dL 82.911.2 (H)  GFR     >56.21>60.00 mL/min 101.34  Vitamin D 1, 25 (OH) Total     18 - 72 pg/mL 110 (H)  Vitamin D3 1, 25 (OH)      110  Vitamin D2 1, 25 (OH)      <8  VITD     30.00 - 100.00 ng/mL 16.74 (L)  PTH     15 - 65 pg/mL 65  Magnesium     1.5 - 2.5 mg/dL 2.1  Phosphorus     2.3 - 4.6 mg/dL 2.3  Vitamin D deficiency >> will need to increase her vitamin D to normal range before checking the Urinary calcium level. Start vitamin D 2000 units daily x 3 months >> recheck vitamin D and calcium then.   Component     Latest Ref Rng 11/11/2013  TSH     0.35 - 4.50 uIU/mL 0.05 (L)  T3, Free     2.3 - 4.2 pg/mL 2.9  Free T4     0.60 - 1.60 ng/dL 0.980.87   Subclinical hyperthyroidism. We will also repeat the TFTs in 3 mo.

## 2013-11-12 LAB — VITAMIN D 25 HYDROXY (VIT D DEFICIENCY, FRACTURES): VITD: 16.74 ng/mL — AB (ref 30.00–100.00)

## 2013-11-12 LAB — MAGNESIUM: MAGNESIUM: 2.1 mg/dL (ref 1.5–2.5)

## 2013-11-12 LAB — BASIC METABOLIC PANEL
BUN: 15 mg/dL (ref 6–23)
CHLORIDE: 109 meq/L (ref 96–112)
CO2: 23 meq/L (ref 19–32)
CREATININE: 0.8 mg/dL (ref 0.4–1.2)
Calcium: 11.2 mg/dL — ABNORMAL HIGH (ref 8.4–10.5)
GFR: 101.34 mL/min (ref >60.00)
Glucose, Bld: 85 mg/dL (ref 70–99)
POTASSIUM: 4.3 meq/L (ref 3.5–5.1)
SODIUM: 135 meq/L (ref 135–145)

## 2013-11-12 LAB — TSH: TSH: 0.05 u[IU]/mL — AB (ref 0.35–4.50)

## 2013-11-12 LAB — PHOSPHORUS: PHOSPHORUS: 2.3 mg/dL (ref 2.3–4.6)

## 2013-11-12 LAB — T3, FREE: T3, Free: 2.9 pg/mL (ref 2.3–4.2)

## 2013-11-12 LAB — T4, FREE: Free T4: 0.87 ng/dL (ref 0.60–1.60)

## 2013-11-13 LAB — PARATHYROID HORMONE, INTACT (NO CA): PTH: 65 pg/mL (ref 15–65)

## 2013-11-15 LAB — VITAMIN D 1,25 DIHYDROXY
Vitamin D 1, 25 (OH)2 Total: 110 pg/mL — ABNORMAL HIGH (ref 18–72)
Vitamin D3 1, 25 (OH)2: 110 pg/mL

## 2014-05-12 ENCOUNTER — Emergency Department (HOSPITAL_COMMUNITY)
Admission: EM | Admit: 2014-05-12 | Discharge: 2014-05-13 | Disposition: A | Discharge status: Home / Self Care | Payer: 59 | Attending: Emergency Medicine | Admitting: Emergency Medicine

## 2014-05-12 ENCOUNTER — Encounter (HOSPITAL_COMMUNITY): Payer: Self-pay | Admitting: Emergency Medicine

## 2014-05-12 DIAGNOSIS — K088 Other specified disorders of teeth and supporting structures: Secondary | ICD-10-CM | POA: Diagnosis present

## 2014-05-12 DIAGNOSIS — I10 Essential (primary) hypertension: Secondary | ICD-10-CM | POA: Insufficient documentation

## 2014-05-12 DIAGNOSIS — K056 Periodontal disease, unspecified: Secondary | ICD-10-CM

## 2014-05-12 DIAGNOSIS — Z79899 Other long term (current) drug therapy: Secondary | ICD-10-CM | POA: Insufficient documentation

## 2014-05-12 DIAGNOSIS — Z792 Long term (current) use of antibiotics: Secondary | ICD-10-CM | POA: Diagnosis not present

## 2014-05-12 DIAGNOSIS — K055 Other periodontal diseases: Secondary | ICD-10-CM | POA: Insufficient documentation

## 2014-05-12 DIAGNOSIS — K047 Periapical abscess without sinus: Secondary | ICD-10-CM | POA: Diagnosis not present

## 2014-05-12 NOTE — ED Notes (Signed)
Pt. reports left upper molar pain onset today unrelieved by OTC Aleve , hypertensive at triage , appointment with her dentist next week but can not wait any longer  .

## 2014-05-13 MED ORDER — PENICILLIN V POTASSIUM 250 MG PO TABS: 250.0000 mg | ORAL_TABLET | Freq: Four times a day (QID) | ORAL | Status: DC

## 2014-05-13 MED ORDER — IBUPROFEN 600 MG PO TABS: 600.0000 mg | ORAL_TABLET | Freq: Four times a day (QID) | ORAL | Status: DC | PRN

## 2014-05-13 MED ORDER — IBUPROFEN 200 MG PO TABS
600.0000 mg | ORAL_TABLET | Freq: Once | ORAL | Status: DC
  Filled 2014-05-13: qty 3

## 2014-05-13 MED ORDER — HYDROCODONE-ACETAMINOPHEN 5-325 MG PO TABS
1.0000 | ORAL_TABLET | Freq: Four times a day (QID) | ORAL | Status: DC | PRN
Start: 2014-05-13 — End: 2016-01-03

## 2014-05-13 NOTE — ED Provider Notes (Signed)
CSN: [id number]     Arrival date & time 05/12/14  2328 History  This chart was scribed for Derwood Kaplan, MD by Tanda Rockers, ED Scribe. This patient was seen in room A11C/A11C and the patient's care was started at 12:02 AM.    Chief Complaint  Patient presents with  . Dental Pain   The history is provided by the patient. No language interpreter was used.     HPI Comments: Catherine Parsons is a 39 y.o. female with hx HTN who presents to the Emergency Department complaining of left upper dental pain that began 3 days ago, worsening today. She also complains of a headache. Pt has been taking Aleve without relief. She has follow up with her dentist next week but could not wait any longer. She denies fever, chills, heat or cold sensitivity to teeth, or any other symptoms.    Past Medical History  Diagnosis Date  . Hypertension    History reviewed. No pertinent past surgical history. No family history on file. History  Substance Use Topics  . Smoking status: Never Smoker   . Smokeless tobacco: Not on file  . Alcohol Use: No   OB History    No data available     Review of Systems  Constitutional: Negative for fever and chills.  HENT: Positive for dental problem. Negative for congestion and rhinorrhea.   Eyes: Negative for discharge and redness.  Respiratory: Negative for cough and shortness of breath.   Cardiovascular: Negative for chest pain.  Gastrointestinal: Negative for nausea and vomiting.  Neurological: Positive for headaches.  Psychiatric/Behavioral: Negative for confusion.      Allergies  Review of patient's allergies indicates no known allergies.  Home Medications   Prior to Admission medications   Medication Sig Start Date End Date Taking? Authorizing Provider  acetaminophen (TYLENOL) 500 MG tablet Take 500 mg by mouth 2 (two) times daily as needed (pain).    Historical Provider, MD  HYDROcodone-acetaminophen (NORCO/VICODIN) 5-325 MG per tablet Take 1 tablet by  mouth every 6 (six) hours as needed. 05/13/14   Derwood Kaplan, MD  ibuprofen (ADVIL,MOTRIN) 600 MG tablet Take 1 tablet (600 mg total) by mouth every 6 (six) hours as needed. 05/13/14   Derwood Kaplan, MD  lisinopril (PRINIVIL,ZESTRIL) 10 MG tablet Take 10 mg by mouth at bedtime.  10/24/13   Historical Provider, MD  metoprolol succinate (TOPROL-XL) 50 MG 24 hr tablet Take 50 mg by mouth daily. Take with or immediately following a meal.    Historical Provider, MD  penicillin v potassium (VEETID) 250 MG tablet Take 1 tablet (250 mg total) by mouth 4 (four) times daily. 05/13/14   Derwood Kaplan, MD   Triage Vitals: BP 180/96 mmHg  Pulse 94  Temp(Src) 98.3 F (36.8 C) (Oral)  Resp 14  Ht  (1.651 m)  Wt 166 lb (75.297 kg)  BMI 27.62 kg/m2  SpO2 98%  LMP 05/12/2014   Physical Exam  Constitutional: She is oriented to person, place, and time. She appears well-developed and well-nourished. No distress.  HENT:  Head: Normocephalic and atraumatic.  Left lower molar teeth - evidence of mild dental decay. No bleeding.  Left upper molar teeth - mild decay as well. No active bleeding.  On gingivi exam - No fluctuance. No abscess.   Eyes: Conjunctivae and EOM are normal.  Neck: Neck supple. No tracheal deviation present.  No anterior cervical lymphadenopathy. Negative trismus.   Cardiovascular: Normal rate, regular rhythm and normal heart sounds.  Pulmonary/Chest: Effort normal. No respiratory distress.  Musculoskeletal: Normal range of motion.  Neurological: She is alert and oriented to person, place, and time.  Skin: Skin is warm and dry.  Psychiatric: She has a normal mood and affect. Her behavior is normal.  Nursing note and vitals reviewed.   ED Course  Procedures (including critical care time)  DIAGNOSTIC STUDIES: Oxygen Saturation is 98% on RA, normal by my interpretation.    COORDINATION OF CARE: 12:06 AM-Discussed treatment plan which includes prescription for pain medication  and antibiotics with pt at bedside and pt agreed to plan.   Labs Review Labs Reviewed - No data to display  Imaging Review No results found.   EKG Interpretation None      MDM   Final diagnoses:  Periodontal disease  Tooth infection    I personally performed the services described in this documentation, which was scribed in my presence. The recorded information has been reviewed and is accurate..  Pt with dental pain. No red flags for any deep infection. Will d/c with antibiotics. She has dentist appt next week.     Derwood KaplanAnkit Debra Calabretta, MD 05/13/14 [phone]

## 2014-05-13 NOTE — Discharge Instructions (Signed)
We saw you in the ER for the toothache. No evidence of deep infection, no evidence of pus that can be drained out. YOU WILL NEED TO SEE A DENTIST to get appropriate care. Please take the antibiotics and pain meds provided in the interval.   Periodontal Disease Periodontal disease, or gum disease, is a type of oral disease that affects the surrounding and supporting tissues of the teeth. These include the gums (gingivae), ligaments, and tooth socket (alveolar bone). Periodontal disease can affect one tooth or many teeth. If left untreated, it may lead to tooth loss.  CAUSES The main cause of periodontal disease is dental plaque, which contains harmful bacteria. These bacteria can cause the gums to become inflamed and infected. Further progression of the disease can damage the other supporting tissues.  RISK FACTORS  Diabetes.   Smoking and tobacco use.   Genetics.   Hormonal changes of puberty, menopause, and pregnancy.   Stress.   Clenching or grinding your teeth.   Substance abuse.  Poor nutrition.   Diseases that interfere with the body's immune system.   Certain medicines. SIGNS AND SYMPTOMS  Red or swollen gums.  Bad breath that does not go away.  Gums that have pulled away from the teeth.  Gums that bleed easily.  Permanent teeth that are loose or separating.  Pain when chewing.  Changes in the way your teeth fit together.  Sensitive teeth. DIAGNOSIS  A thorough examination of the periodontal tissues will be done by your dentist. X-rays may be needed. Evaluation of your medical history will be needed to see if there are other factors or underlying conditions that may contribute to the disease. TREATMENT The number and types of treatment will vary depending on the extent of the disease. Treatment may include brushing and flossing only. Further disease progression may necessitate scaling and root planing or even surgery. The main goal is to control the  infection. Good oral hygiene at home is necessary for the success of all types of treatment. HOME CARE INSTRUCTIONS   Practice good oral hygiene. This includes flossing and brushing your teeth every day.   See your dentist regularly, at least 2 times per year.   Stop smoking if you smoke.  Eat a well-balanced diet. SEEK IMMEDIATE DENTAL CARE IF:   You have any signs or symptoms of periodontal disease along with:  Swelling of your face, neck, or jaw.  Inability to open your mouth.  Severe pain uncontrolled by pain medicine.  You have a fever or persistent symptoms for more than 2-3 days.  You have a fever and your symptoms suddenly get worse. Document Released: 01/19/2003 Document Revised: 09/18/2012 Document Reviewed: 06/25/2012 [facility] Patient Information 2015 [place], [place]. This information is not intended to replace advice given to you by your health care provider. Make sure you discuss any questions you have with your health care provider.

## 2014-07-10 ENCOUNTER — Other Ambulatory Visit (HOSPITAL_COMMUNITY)
Admission: RE | Admit: 2014-07-10 | Discharge: 2014-07-10 | Disposition: A | Discharge status: Home / Self Care | Payer: 59 | Source: Ambulatory Visit | Attending: Physician Assistant | Admitting: Physician Assistant

## 2014-07-10 ENCOUNTER — Other Ambulatory Visit: Payer: Self-pay | Admitting: Physician Assistant

## 2014-07-10 DIAGNOSIS — Z1151 Encounter for screening for human papillomavirus (HPV): Secondary | ICD-10-CM | POA: Diagnosis present

## 2014-07-10 DIAGNOSIS — Z124 Encounter for screening for malignant neoplasm of cervix: Secondary | ICD-10-CM | POA: Diagnosis not present

## 2014-07-14 LAB — CYTOLOGY - PAP

## 2014-09-10 ENCOUNTER — Ambulatory Visit: Payer: 59 | Admitting: Internal Medicine

## 2014-09-11 ENCOUNTER — Ambulatory Visit (INDEPENDENT_AMBULATORY_CARE_PROVIDER_SITE_OTHER): Payer: 59 | Admitting: Internal Medicine

## 2014-09-11 ENCOUNTER — Encounter: Payer: Self-pay | Admitting: Internal Medicine

## 2014-09-11 DIAGNOSIS — R7989 Other specified abnormal findings of blood chemistry: Secondary | ICD-10-CM

## 2014-09-11 DIAGNOSIS — R946 Abnormal results of thyroid function studies: Secondary | ICD-10-CM | POA: Diagnosis not present

## 2014-09-11 NOTE — Patient Instructions (Signed)
Please have labs in your PCP's office.  Please come back for a follow-up appointment in 3 months.  Please try to join MyChart for easier communication.

## 2014-09-11 NOTE — Progress Notes (Signed)
Patient ID: Catherine Parsons, female   DOB: Nov 12, 1975, 39 y.o.   MRN: [id number]   HPI  Catherine Parsons is a 39 y.o.-year-old female, returning for f/u for hypercalcemia/hyperparathyroidism and low TSH. Last visit 10 mo ago.  Reviewed and addended hx: Pt was dx with hypercalcemia in 03/2013.  Lab Results  Component Value Date   PTH 65 11/11/2013   CALCIUM 11.2* 11/11/2013   CALCIUM 10.5 04/05/2010   CALCIUM 9.3 07/02/2008  10/08/2013: PTH 68 10/02/2013: Ca 11.3 (8.6-10.3) 08/07/2013: iCa 1.52 (1.12-1.32) 04/12/2013: Ca 11.4 (8.6-10.3) 04/10/2013: Ca 11.1   No fractures or falls.   No h/o kidney stones.  No h/o CKD. Last BUN/Cr: 12/0.65 on 03/12/2-15 Lab Results  Component Value Date   BUN 15 11/11/2013   CREATININE 0.8 11/11/2013   Pt is/was not on HCTZ.  Pt is not on calcium and vitamin D; she also eats dairy but not every day and some green, leafy, vegetables.  Pt does not have a FH of hypercalcemia, pituitary tumors, thyroid cancer, or osteoporosis.   At last visit, we checked her for primary hyperparathyroidism and the vitamin D returned low, while the rest of the labs pointed towards Primary HPTH: Component     Latest Ref Rng 11/11/2013  Sodium     135 - 145 mEq/L 135  Potassium     3.5 - 5.1 mEq/L 4.3  Chloride     96 - 112 mEq/L 109  CO2     19 - 32 mEq/L 23  Glucose     70 - 99 mg/dL 85  BUN     6 - 23 mg/dL 15  Creatinine     0.4 - 1.2 mg/dL 0.8  Calcium     8.4 - 10.5 mg/dL 40.9 (H)  GFR     >81.19 mL/min 101.34  Vitamin D 1, 25 (OH) Total     18 - 72 pg/mL 110 (H)  Vitamin D3 1, 25 (OH)      110  Vitamin D2 1, 25 (OH)      <8  VITD     30.00 - 100.00 ng/mL 16.74 (L)  PTH     15 - 65 pg/mL 65  Magnesium     1.5 - 2.5 mg/dL 2.1  Phosphorus     2.3 - 4.6 mg/dL 2.3   Vitamin D deficiency >> I advised her that we need to increase her vitamin D to normal range before checking the Urinary calcium level. Started vitamin D 2000 units daily with  plan to recheck in 3 mo >> did not return.  Low TSH: Component     Latest Ref Rng 11/11/2013  TSH     0.35 - 4.50 uIU/mL 0.05 (L)  T3, Free     2.3 - 4.2 pg/mL 2.9  Free T4     0.60 - 1.60 ng/dL 1.47   82/95/6213: TSH 0.26 08/25/2013: TSH 0.26  She denies: - palpitations - tremors - weight loss - heat intolerance, but occasional hot flushes at night - hyperdefecation - hair loss  + FH of thyroid pbs in mother. No FH of thyroid cancer. No radiation tx to head and neck.  She also has a history of HTN.   ROS: Constitutional: + weight gain, + fatigue, no subjective hyperthermia/hypothermia, + poor sleep Eyes: no blurry vision, no xerophthalmia ENT: no sore throat, no nodules palpated in throat, no dysphagia/odynophagia, no hoarseness Cardiovascular: no CP/SOB/palpitations/leg swelling Respiratory: no cough/SOB Gastrointestinal: no N/V/D/C Musculoskeletal: no muscle/joint aches  Skin: no rashes Neurological: no tremors/numbness/tingling/dizziness, + HA  I reviewed pt's medications, allergies, PMH, social hx, family hx, and changes were documented in the history of present illness. Otherwise, unchanged from my initial visit note:  Past Medical History  Diagnosis Date  . Hypertension    No past surgical history on file.  History   Social History  . Marital Status: Single    Spouse Name: N/A    Number of Children: 3: 75, 55, 36 y/o   Occupational History  . childcare   Social History Main Topics  . Smoking status: former Smoker, quit 2009  . Smokeless tobacco: Not on file  . Alcohol Use: No  . Drug Use: No   Current Outpatient Rx  Name  Route  Sig  Dispense  Refill  . acetaminophen (TYLENOL) 500 MG tablet   Oral   Take 500 mg by mouth 2 (two) times daily as needed (pain).         Marland Kitchen lisinopril (PRINIVIL,ZESTRIL) 10 MG tablet   Oral   Take 10 mg by mouth at bedtime.          . metoprolol succinate (TOPROL-XL) 50 MG 24 hr tablet   Oral   Take 50 mg by  mouth daily. Take with or immediately following a meal.         . HYDROcodone-acetaminophen (NORCO/VICODIN) 5-325 MG per tablet   Oral   Take 1 tablet by mouth every 6 (six) hours as needed. Patient not taking: Reported on 09/11/2014   10 tablet   0   . ibuprofen (ADVIL,MOTRIN) 600 MG tablet   Oral   Take 1 tablet (600 mg total) by mouth every 6 (six) hours as needed. Patient not taking: Reported on 09/11/2014   30 tablet   0   . penicillin v potassium (VEETID) 250 MG tablet   Oral   Take 1 tablet (250 mg total) by mouth 4 (four) times daily. Patient not taking: Reported on 09/11/2014   40 tablet   0    No Known Allergies  FH: - HTN in M, MGM - thyroid ds in M  PE: BP 116/70 mmHg  Pulse 69  Temp(Src) 98.4 F (36.9 C) (Oral)  Resp 12  Wt 168 lb 12.8 oz (76.567 kg)  SpO2 97% Wt Readings from Last 3 Encounters:  09/11/14 168 lb 12.8 oz (76.567 kg)  05/12/14 166 lb (75.297 kg)  11/11/13 170 lb 3.2 oz (77.202 kg)   Constitutional: overweight, in NAD. No kyphosis. Eyes: PERRLA, EOMI, no exophthalmos ENT: moist mucous membranes, no thyromegaly, no cervical lymphadenopathy Cardiovascular: RRR, No MRG Respiratory: CTA B Gastrointestinal: abdomen soft, NT, ND, BS+ Musculoskeletal: no deformities, strength intact in all 4 Skin: moist, warm, no rashes Neurological: no tremor with outstretched hands, DTR normal in all 4  Assessment: 1. Hypercalcemia/hyperparathyroidism  2. Subclinical hypothyroidism  Plan: Patient has had several instances of elevated calcium, with the highest level being at 11.3. A corresponding intact PTH level was also high, at 68. At our last visit, I checked labs and this showed vitamin D deficiency, so I advised her to start taking vitamin D supplement and return for labs. She did not return and did not start vitamin D. At this visit, I explained that it is very likely that she does have a parathyroid adenoma based on the high PTH level with a  borderline/high calcium. However, we can only fully evaluate and proceed with treatment in the setting of a normal vitamin D. I  will recheck this today and then we will need to bring it to normal before proceeding with further testing.  - we reviewed the labs that she had at last visit: High calcium, inappropriately normal PTH, high 1, 25 dihydroxy vitamin D, low normal phosphorus and normal magnesium.  - I again discussed with the patient about the physiology of calcium and parathyroid hormone, and possible side effects from increased PTH, including kidney stones, osteoporosis, abdominal pain, etc.  - we also discussed about criteria for parathyroid surgery:  Increased calcium by more than 1 mg/dL above the upper limit of normal  Kidney ds.  Osteoporosis (or Vb fx) Age <57 years old New (2013): High UCa >400 mg/d and increased stone risk by biochemical stone risk analysis Presence of nephrolithiasis or nephrocalcinosis Pt's preference!  - she does meet 2 criteria - in bold above - when her vitamin D level normalizes, we will need to repeat a calcium level, intact PTH ([facility]), and perform a 24h urinary calcium/creatinine ratio - pt given instructions for urine collection and the jugAt last visit  - If the tests indicate a parathyroid adenoma, I will refer her to surgery. We discussed possible consequences of hyperparathyroidism: ~1/3 pts will develop complications over 15 years (OP, nephrolithiasis). I will wait for the results of the above labs and will discuss with the plan with the patient.  - I will see the patient back in 3 months  2. Low TSH - we discussed that she may have mild overactive thyroid.  - today we will check: TSH, fT4 and fT3 - we discussed about possible signs of thyrotoxicosis, which she denies  Received records from PCP  - 07/10/2014: Calcium 11.4 (8.6-10.3) - 07/20/2014: PTH 118

## 2014-10-02 ENCOUNTER — Other Ambulatory Visit: Payer: Self-pay | Admitting: *Deleted

## 2014-10-02 ENCOUNTER — Other Ambulatory Visit (INDEPENDENT_AMBULATORY_CARE_PROVIDER_SITE_OTHER): Payer: 59

## 2014-10-02 DIAGNOSIS — E559 Vitamin D deficiency, unspecified: Secondary | ICD-10-CM | POA: Diagnosis not present

## 2014-10-02 DIAGNOSIS — R7989 Other specified abnormal findings of blood chemistry: Secondary | ICD-10-CM

## 2014-10-02 DIAGNOSIS — R946 Abnormal results of thyroid function studies: Secondary | ICD-10-CM | POA: Diagnosis not present

## 2014-10-02 LAB — BASIC METABOLIC PANEL
BUN: 11 mg/dL (ref 6–23)
CO2: 25 meq/L (ref 19–32)
Calcium: 11.1 mg/dL — ABNORMAL HIGH (ref 8.4–10.5)
Chloride: 109 mEq/L (ref 96–112)
Creatinine, Ser: 0.72 mg/dL (ref 0.40–1.20)
GFR: 115.56 mL/min (ref >60.00)
Glucose, Bld: 89 mg/dL (ref 70–99)
Potassium: 3.9 mEq/L (ref 3.5–5.1)
SODIUM: 140 meq/L (ref 135–145)

## 2014-10-02 LAB — VITAMIN D 25 HYDROXY (VIT D DEFICIENCY, FRACTURES): VITD: 21.19 ng/mL — ABNORMAL LOW (ref 30.00–100.00)

## 2014-10-02 LAB — T3, FREE: T3, Free: 3.1 pg/mL (ref 2.3–4.2)

## 2014-10-02 LAB — TSH: TSH: 0.41 u[IU]/mL (ref 0.35–4.50)

## 2014-10-02 LAB — T4, FREE: Free T4: 1.06 ng/dL (ref 0.60–1.60)

## 2014-10-06 ENCOUNTER — Other Ambulatory Visit: Payer: Self-pay | Admitting: *Deleted

## 2014-12-14 ENCOUNTER — Ambulatory Visit: Payer: 59 | Admitting: Internal Medicine

## 2014-12-14 DIAGNOSIS — Z0289 Encounter for other administrative examinations: Secondary | ICD-10-CM

## 2014-12-16 ENCOUNTER — Encounter: Payer: Self-pay | Admitting: *Deleted

## 2015-10-29 ENCOUNTER — Ambulatory Visit: Payer: 59 | Admitting: Podiatry

## 2015-12-04 ENCOUNTER — Encounter: Payer: Self-pay | Admitting: *Deleted

## 2015-12-04 ENCOUNTER — Ambulatory Visit
Admission: EM | Admit: 2015-12-04 | Discharge: 2015-12-04 | Disposition: A | Discharge status: Home / Self Care | Payer: 59 | Attending: Family Medicine | Admitting: Family Medicine

## 2015-12-04 DIAGNOSIS — R319 Hematuria, unspecified: Secondary | ICD-10-CM | POA: Diagnosis not present

## 2015-12-04 DIAGNOSIS — N390 Urinary tract infection, site not specified: Secondary | ICD-10-CM

## 2015-12-04 LAB — URINALYSIS COMPLETE WITH MICROSCOPIC (ARMC ONLY)
BILIRUBIN URINE: NEGATIVE
Glucose, UA: NEGATIVE mg/dL
Ketones, ur: NEGATIVE mg/dL
NITRITE: POSITIVE — AB
PH: 5.5 (ref 5.0–8.0)
Protein, ur: 30 mg/dL — AB
SPECIFIC GRAVITY, URINE: 1.02 (ref 1.005–1.030)

## 2015-12-04 LAB — PREGNANCY, URINE: PREG TEST UR: NEGATIVE

## 2015-12-04 MED ORDER — SULFAMETHOXAZOLE-TRIMETHOPRIM 800-160 MG PO TABS: 1.0000 | ORAL_TABLET | Freq: Two times a day (BID) | ORAL | 0 refills | Status: AC

## 2015-12-04 NOTE — ED Triage Notes (Signed)
Patient started having symptoms of body aches, headache, and fever 6 days ago. [facility]

## 2015-12-04 NOTE — ED Provider Notes (Signed)
[facility] ____________________________________________  Time seen: Approximately 6:37 PM  I have reviewed the triage vital signs and the nursing notes.   HISTORY  Chief Complaint Generalized Body Aches and Fever   HPI Catherine Parsons is a 40 y.o. female presents with complaints of not feeling well. Patient reports she has not been feeling well this entire week. Patient reports this past Monday she started have some abdominal discomfort that then resolved, however by the end of her shift she felt she had a fever. Patient reports she been went to [facility] on Tuesday where she was diagnosed with the flu. Patient states she was started on Tamiflu, and then 2 days later she was still not feeling well and went back to the emergency room. Patient reports she has been told again that she had the flu. Patient states she has had intermittent fevers throughout this week that would improve with taking ibuprofen. Denies known fever today. States last took ibuprofen at 12 PM today. Patient states throughout this week she has not had any runny nose, nasal congestion or cough. Patient reports she is urinating somewhat more frequently, but also reports drinking more water. Reports continues to eat and drink well. Patient denies any burning with urination or vaginal complaints. Patient states that she needs a work note as she missed work today because she is not back to 100%.  Patient declines any other testing performed in the emergency room during her visits this week. Patient denies any chest pain, shortness of breath, abdominal pain, cough, congestion, sore throat, dizziness, extremity pain, extremity swelling or any other complaints. Patient states that she just doesn't feel back to 100%.   Patient's last menstrual period was 12/01/2015.Denies pregnancy.  REDMON,NOELLE, PA-CPCP   Past Medical History:  Diagnosis Date  . Hypertension     Patient Active Problem List   Diagnosis Date  Noted  . Hypercalcemia 11/11/2013  . Low TSH level 11/11/2013    History reviewed. No pertinent surgical history.  Current Outpatient Rx  . Order #: [id number] Class: Historical Med  . Order #: [id number] Class: Print  . Order #: [id number] Class: Historical Med  . Order #: [id number] Class: Historical Med  . Order #: [id number] Class: Print  . Order #: [id number] Class: Print  . Order #: [id number] Class: Normal    No current facility-administered medications for this encounter.   Current Outpatient Prescriptions:  .  acetaminophen (TYLENOL) 500 MG tablet, Take 500 mg by mouth 2 (two) times daily as needed (pain)., Disp: , Rfl:  .  ibuprofen (ADVIL,MOTRIN) 600 MG tablet, Take 1 tablet (600 mg total) by mouth every 6 (six) hours as needed., Disp: 30 tablet, Rfl: 0 .  lisinopril (PRINIVIL,ZESTRIL) 10 MG tablet, Take 10 mg by mouth at bedtime. , Disp: , Rfl:  .  metoprolol succinate (TOPROL-XL) 50 MG 24 hr tablet, Take 50 mg by mouth daily. Take with or immediately following a meal., Disp: , Rfl:  .  sulfamethoxazole-trimethoprim (BACTRIM DS,SEPTRA DS) 800-160 MG tablet, Take 1 tablet by mouth 2 (two) times daily., Disp: 10 tablet, Rfl: 0  Allergies Review of patient's allergies indicates no known allergies.  History reviewed. No pertinent family history.  Social History Social History  Substance Use Topics  . Smoking status: Never Smoker  . Smokeless tobacco: Never Used  . Alcohol use No    Review of Systems Constitutional: No fever/chills Eyes: No visual changes. ENT: No sore throat. Cardiovascular: Denies chest pain. Respiratory: Denies shortness of breath. Gastrointestinal: No abdominal pain.  No  nausea, no vomiting.  No diarrhea.  No constipation. Genitourinary: Negative for dysuria. Musculoskeletal: Negative for back pain. Skin: Negative for rash. Neurological: Negative for headaches, focal weakness or numbness.  10-point ROS otherwise  negative.  ____________________________________________   PHYSICAL EXAM:  VITAL SIGNS: ED Triage Vitals  Enc Vitals Group     BP 12/04/15 1555 137/90     Pulse Rate 12/04/15 1555 86     Resp 12/04/15 1555 16     Temp 12/04/15 1555 97.3 F (36.3 C)     Temp Source 12/04/15 1555 Oral     SpO2 12/04/15 1555 100 %     Weight 12/04/15 1556 160 lb (72.6 kg)     Height 12/04/15 1556 5\' 5"  (1.651 m)     Head Circumference --      Peak Flow --      Pain Score 12/04/15 1600 6     Pain Loc --      Pain Edu? --      Excl. in GC? --     Constitutional: Alert and oriented. Well appearing and in no acute distress. Eyes: Conjunctivae are normal. PERRL. EOMI. ENT      Head: Normocephalic and atraumatic.      Ears: no erythema, normal TM bilaterally.       Nose: No congestion/rhinnorhea.      Mouth/Throat: Mucous membranes are moist.Oropharynx non-erythematous. No tonsillar swelling or exudate.  Neck: No stridor. Supple without meningismus.  Hematological/Lymphatic/Immunilogical: No cervical lymphadenopathy. Cardiovascular: Normal rate, regular rhythm. Grossly normal heart sounds.  Good peripheral circulation. Respiratory: Normal respiratory effort without tachypnea nor retractions. Breath sounds are clear and equal bilaterally. No wheezes/rales/rhonchi.. Gastrointestinal: Soft and nontender. No distention. No CVA tenderness. Musculoskeletal:  Nontender with normal range of motion in all extremities. No midline cervical, thoracic or lumbar tenderness to palpation. Bilateral pedal pulses equal and easily palpated.      Right lower leg:  No tenderness or edema.      Left lower leg:  No tenderness or edema.  Neurologic:  Normal speech and language. No gross focal neurologic deficits are appreciated. Speech is normal. No gait instability.  Skin:  Skin is warm, dry and intact. No rash noted. Psychiatric: Mood and affect are normal. Speech and behavior are normal. Patient exhibits appropriate  insight and judgment   ___________________________________________   LABS (all labs ordered are listed, but only abnormal results are displayed)  Labs Reviewed  URINALYSIS COMPLETEWITH MICROSCOPIC ([facility] ONLY) - Abnormal; Notable for the following:       Result Value   APPearance CLOUDY (*)    Hgb urine dipstick MODERATE (*)    Protein, ur 30 (*)    Nitrite POSITIVE (*)    Leukocytes, UA LARGE (*)    Bacteria, UA MANY (*)    Squamous Epithelial / LPF 6-30 (*)    All other components within normal limits  URINE CULTURE  PREGNANCY, URINE     PROCEDURES Procedures    INITIAL IMPRESSION / ASSESSMENT AND PLAN / ED COURSE  Pertinent labs & imaging results that were available during my care of the patient were reviewed by me and considered in my medical decision making (see chart for details).  Very well-appearing patient. No acute distress. Presenting for the complaints of not feeling well. Patient reports 2 visits in the emergency room this past week diagnosed with influenza-like illness. Patient denies any cough, congestion or any other symptoms except that of fever, body aches and overall not feeling well.  Patient reports not improved with normal oral Tamiflu. Patient exam is unremarkable. Lungs clear throughout. Discussed in detail with patient evaluating source of fever and patient declines chest x-ray. Encourage evaluation of urinalysis.  Urinalysis reviewed, suspect urinary tract infection. Will culture urine. Urine pregnancy test negative. Will empirically treat patient with oral Bactrim. Encouraged supportive care, rest, fluids and discussed follow up with primary care physician. Work note given for today and tomorrow.  Discussed follow up with Primary care physician this week. Discussed follow up and return parameters including no resolution or any worsening concerns. Patient verbalized understanding and agreed to plan.    ____________________________________________   FINAL CLINICAL IMPRESSION(S) / ED DIAGNOSES  Final diagnoses:  Urinary tract infection with hematuria, site unspecified     Discharge Medication List as of 12/04/2015  5:33 PM    START taking these medications   Details  sulfamethoxazole-trimethoprim (BACTRIM DS,SEPTRA DS) 800-160 MG tablet Take 1 tablet by mouth 2 (two) times daily., Starting Sat 12/04/2015, Until Thu 12/09/2015, Normal        Note: This dictation was prepared with Dragon dictation along with smaller phrase technology. Any transcriptional errors that result from this process are unintentional.    Clinical Course      Renford DillsLindsey Sumiye Hirth, NP 12/04/15 1845

## 2015-12-04 NOTE — Discharge Instructions (Signed)
Take medication as prescribed. Rest. Drink plenty of fluids.  ° °Follow up with your primary care physician this week as needed. Return to Urgent care for new or worsening concerns.  ° °

## 2015-12-07 LAB — URINE CULTURE

## 2015-12-08 ENCOUNTER — Telehealth: Payer: Self-pay | Admitting: Emergency Medicine

## 2015-12-08 NOTE — Telephone Encounter (Signed)
Patient notified of her urine culture result.  Patient on Bactrim.  Result show sensitive to Bactrim.  Patient states that her symptoms are improving.  Patient to continue with her antibiotic and to follow-up here or with PCP if symptoms worsen. Patient verbalized understanding.

## 2016-01-03 ENCOUNTER — Ambulatory Visit
Admission: EM | Admit: 2016-01-03 | Discharge: 2016-01-03 | Disposition: A | Discharge status: Home / Self Care | Payer: 59 | Attending: Family Medicine | Admitting: Family Medicine

## 2016-01-03 DIAGNOSIS — R319 Hematuria, unspecified: Secondary | ICD-10-CM

## 2016-01-03 DIAGNOSIS — R110 Nausea: Secondary | ICD-10-CM

## 2016-01-03 DIAGNOSIS — N390 Urinary tract infection, site not specified: Secondary | ICD-10-CM | POA: Diagnosis not present

## 2016-01-03 LAB — URINALYSIS COMPLETE WITH MICROSCOPIC (ARMC ONLY)
Bilirubin Urine: NEGATIVE
Glucose, UA: NEGATIVE mg/dL
KETONES UR: NEGATIVE mg/dL
LEUKOCYTES UA: NEGATIVE
NITRITE: NEGATIVE
PH: 7 (ref 5.0–8.0)
PROTEIN: 30 mg/dL — AB
SPECIFIC GRAVITY, URINE: 1.02 (ref 1.005–1.030)

## 2016-01-03 LAB — RAPID INFLUENZA A&B ANTIGENS
Influenza A (ARMC): NEGATIVE
Influenza B (ARMC): NEGATIVE

## 2016-01-03 LAB — PREGNANCY, URINE: Preg Test, Ur: NEGATIVE

## 2016-01-03 MED ORDER — ONDANSETRON 8 MG PO TBDP
8.0000 mg | ORAL_TABLET | Freq: Once | ORAL | Status: AC
  Administered 2016-01-03: 8 mg via ORAL

## 2016-01-03 MED ORDER — ACETAMINOPHEN 500 MG PO TABS
1000.0000 mg | ORAL_TABLET | Freq: Once | ORAL | Status: AC
  Administered 2016-01-03: 1000 mg via ORAL

## 2016-01-03 MED ORDER — CIPROFLOXACIN HCL 500 MG PO TABS: 500.0000 mg | ORAL_TABLET | Freq: Two times a day (BID) | ORAL | 0 refills | Status: DC

## 2016-01-03 MED ORDER — ONDANSETRON 8 MG PO TBDP: 8.0000 mg | ORAL_TABLET | Freq: Three times a day (TID) | ORAL | 0 refills | Status: DC | PRN

## 2016-01-03 NOTE — ED Triage Notes (Signed)
Pt says she feels very tired, body aches, dehydrated. She mentions that her urine has an odor and that a few red bumps have appeared on her face on her cheeks and forehead. She has also had a fever and headache for the last couple of days.

## 2016-01-03 NOTE — ED Provider Notes (Signed)
[facility]    CSN: [id number] Arrival date & time: 01/03/16  1202     History   Chief [id number] Chief Complaint  Patient presents with  . Rash  . Generalized Body Aches    HPI Vista DeckKeisha L Wasco is a 40 y.o. female.    Rash  Associated symptoms: fever and nausea   Associated symptoms: no abdominal pain and not vomiting   Dysuria  Pain quality:  Aching Pain severity:  Mild Onset quality:  Sudden Duration:  3 days Timing:  Constant Progression:  Worsening Chronicity:  New Recent urinary tract infections: yes (1 month ago)   Relieved by:  None tried Ineffective treatments:  None tried Associated symptoms: fever and nausea   Associated symptoms: no abdominal pain, no flank pain, no genital lesions, no vaginal discharge and no vomiting   Risk factors: no hx of pyelonephritis, no hx of urolithiasis, no kidney transplant, not pregnant, no recurrent urinary tract infections, no renal cysts, no renal disease, no sexually transmitted infections, no single kidney and no urinary catheter     Past Medical History:  Diagnosis Date  . Hypertension     Patient Active Problem List   Diagnosis Date Noted  . Hypercalcemia 11/11/2013  . Low TSH level 11/11/2013    History reviewed. No pertinent surgical history.  OB History    No data available       Home Medications    Prior to Admission medications   Medication Sig Start Date End Date Taking? Authorizing Provider  ibuprofen (ADVIL,MOTRIN) 600 MG tablet Take 1 tablet (600 mg total) by mouth every 6 (six) hours as needed. 05/13/14  Yes Derwood KaplanAnkit Nanavati, MD  lisinopril (PRINIVIL,ZESTRIL) 10 MG tablet Take 10 mg by mouth at bedtime.  10/24/13  Yes Historical Provider, MD  metoprolol succinate (TOPROL-XL) 50 MG 24 hr tablet Take 50 mg by mouth daily. Take with or immediately following a meal.   Yes Historical Provider, MD  acetaminophen (TYLENOL) 500 MG tablet Take 500 mg by mouth 2 (two) times daily as needed (pain).     Historical Provider, MD  ciprofloxacin (CIPRO) 500 MG tablet Take 1 tablet (500 mg total) by mouth every 12 (twelve) hours. 01/03/16   Payton Mccallumrlando Marylynne Keelin, MD  ondansetron (ZOFRAN ODT) 8 MG disintegrating tablet Take 1 tablet (8 mg total) by mouth every 8 (eight) hours as needed for nausea or vomiting. 01/03/16   Payton Mccallumrlando Beya Tipps, MD    Family History History reviewed. No pertinent family history.  Social History Social History  Substance Use Topics  . Smoking status: Never Smoker  . Smokeless tobacco: Never Used  . Alcohol use No     Allergies   Patient has no known allergies.   Review of Systems Review of Systems  Constitutional: Positive for fever.  Gastrointestinal: Positive for nausea. Negative for abdominal pain and vomiting.  Genitourinary: Positive for dysuria. Negative for flank pain and vaginal discharge.  Skin: Positive for rash.     Physical Exam Triage Vital Signs ED Triage Vitals  Enc Vitals Group     BP 01/03/16 1226 (!) 143/99     Pulse Rate 01/03/16 1226 (!) 117     Resp 01/03/16 1226 18     Temp 01/03/16 1226 (!) 102.6 F (39.2 C)     Temp Source 01/03/16 1226 Oral     SpO2 01/03/16 1226 98 %     Weight 01/03/16 1224 160 lb (72.6 kg)     Height 01/03/16 1224 5\' 5"  (  1.651 m)     Head Circumference --      Peak Flow --      Pain Score 01/03/16 1225 8     Pain Loc --      Pain Edu? --      Excl. in GC? --    No data found.   Updated Vital Signs BP (!) 143/99 (BP Location: Left Arm)   Pulse (!) 117   Temp (!) 102.6 F (39.2 C) (Oral)   Resp 18   Ht 5\' 5"  (1.651 m)   Wt 160 lb (72.6 kg)   LMP 12/24/2015   SpO2 98%   BMI 26.63 kg/m   Visual Acuity Right Eye Distance:   Left Eye Distance:   Bilateral Distance:    Right Eye Near:   Left Eye Near:    Bilateral Near:     Physical Exam  Constitutional: She appears well-developed and well-nourished. No distress.  Abdominal: Soft. Bowel sounds are normal. She exhibits no distension and no mass.  There is tenderness (mild, suprapubic). There is no rebound and no guarding.  Skin: She is not diaphoretic.  Nursing note and vitals reviewed.    UC Treatments / Results  Labs (all labs ordered are listed, but only abnormal results are displayed) Labs Reviewed  URINALYSIS COMPLETEWITH MICROSCOPIC ([facility] ONLY) - Abnormal; Notable for the following:       Result Value   APPearance CLOUDY (*)    Hgb urine dipstick MODERATE (*)    Protein, ur 30 (*)    Bacteria, UA MANY (*)    Squamous Epithelial / LPF 6-30 (*)    All other components within normal limits  RAPID INFLUENZA A&B ANTIGENS ([facility] ONLY)  URINE CULTURE  PREGNANCY, URINE    EKG  EKG Interpretation None       Radiology No results found.  Procedures Procedures (including critical care time)  Medications Ordered in UC Medications  acetaminophen (TYLENOL) tablet 1,000 mg (1,000 mg Oral Given 01/03/16 1251)  ondansetron (ZOFRAN-ODT) disintegrating tablet 8 mg (8 mg Oral Given 01/03/16 1326)     Initial Impression / Assessment and Plan / UC Course  I have reviewed the triage vital signs and the nursing notes.  Pertinent labs & imaging results that were available during my care of the patient were reviewed by me and considered in my medical decision making (see chart for details).  Clinical Course       Final Clinical Impressions(s) / UC Diagnoses   Final diagnoses:  Urinary tract infection with hematuria, site unspecified  Nausea    New Prescriptions Discharge Medication List as of 01/03/2016  1:08 PM    START taking these medications   Details  ciprofloxacin (CIPRO) 500 MG tablet Take 1 tablet (500 mg total) by mouth every 12 (twelve) hours., Starting Mon 01/03/2016, Normal    ondansetron (ZOFRAN ODT) 8 MG disintegrating tablet Take 1 tablet (8 mg total) by mouth every 8 (eight) hours as needed for nausea or vomiting., Starting Mon 01/03/2016, Normal       1. Lab results and diagnosis reviewed with  patient 2. rx as per orders above; reviewed possible side effects, interactions, risks and benefits  3. Recommend supportive treatment with increased water 4. Follow-up prn if symptoms worsen or don't improve   Payton Mccallumrlando Iolanda Folson, MD 01/03/16 1345

## 2016-01-05 LAB — URINE CULTURE: Culture: >=100000 — AB

## 2016-01-06 ENCOUNTER — Telehealth: Payer: Self-pay

## 2016-01-06 NOTE — Telephone Encounter (Signed)
Courtesy call back completed today after patients visit at [facility]. Patient improved and will follow up with their PCP if symptoms continue or worsen.   

## 2016-05-11 ENCOUNTER — Other Ambulatory Visit: Payer: Self-pay | Admitting: Pediatrics

## 2016-05-11 DIAGNOSIS — Z1239 Encounter for other screening for malignant neoplasm of breast: Secondary | ICD-10-CM

## 2018-04-12 ENCOUNTER — Other Ambulatory Visit: Payer: Self-pay | Admitting: Family Medicine

## 2018-04-12 ENCOUNTER — Encounter: Payer: Self-pay | Admitting: Emergency Medicine

## 2018-04-12 ENCOUNTER — Other Ambulatory Visit: Payer: Self-pay

## 2018-04-12 ENCOUNTER — Ambulatory Visit
Admission: EM | Admit: 2018-04-12 | Discharge: 2018-04-12 | Disposition: A | Discharge status: Home / Self Care | Payer: Federal, State, Local not specified - PPO | Attending: Family Medicine | Admitting: Family Medicine

## 2018-04-12 DIAGNOSIS — B349 Viral infection, unspecified: Secondary | ICD-10-CM

## 2018-04-12 DIAGNOSIS — R0602 Shortness of breath: Secondary | ICD-10-CM

## 2018-04-12 MED ORDER — ALBUTEROL SULFATE HFA 108 (90 BASE) MCG/ACT IN AERS: 1.0000 | INHALATION_SPRAY | Freq: Four times a day (QID) | RESPIRATORY_TRACT | 0 refills | Status: DC | PRN

## 2018-04-12 NOTE — ED Triage Notes (Signed)
Patient c/o SOB and tightness in her throat that started last night.  Patient denies cough or sore throat.  Patient denies fevers.  Patient states that she was at her [employer] on Wed. And an employee that was beside her has been now been quarantine for possible coronavirus.

## 2018-04-12 NOTE — ED Provider Notes (Signed)
[facility]    CSN: [id number] Arrival date & time: 04/12/18  1926  History   Chief Complaint Chief Complaint  Patient presents with  . Shortness of Breath   HPI  43 year old female presents with the above complaint.  Patient reports that she feels like her throat is tight and that she feels to me like she cannot take a good deep breath.  Started last night.  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  She states that an employee that was beside her is now quarantined for possible coronavirus.  Patient denies fever.  No cough.  No medication or interventions tried.  Patient states that her shortness of breath seems to be with exertion.  No relieving factors.  No other associated symptoms.  No other complaints.  PMH, Surgical Hx, Family Hx, Social History reviewed and updated as below.  Past Medical History:  Diagnosis Date  . Hypertension    Patient Active Problem List   Diagnosis Date Noted  . Hypercalcemia 11/11/2013  . Low TSH level 11/11/2013    History reviewed. No pertinent surgical history.  OB History   No obstetric history on file.      Home Medications    Prior to Admission medications   Medication Sig Start Date End Date Taking? Authorizing Provider  amLODipine (NORVASC) 5 MG tablet Take by mouth. 05/30/17 05/30/18 Yes [provider]  lisinopril (PRINIVIL,ZESTRIL) 10 MG tablet Take 10 mg by mouth at bedtime.  10/24/13  Yes [provider]  metoprolol succinate (TOPROL-XL) 50 MG 24 hr tablet Take 50 mg by mouth daily. Take with or immediately following a meal.   Yes [provider]  acetaminophen (TYLENOL) 500 MG tablet Take 500 mg by mouth 2 (two) times daily as needed (pain).    [provider]  albuterol (PROVENTIL HFA;VENTOLIN HFA) 108 (90 Base) MCG/ACT inhaler Inhale 1-2 puffs into the lungs every 6 (six) hours as needed for wheezing or shortness of breath. 04/12/18   Tommie Sams, DO   ibuprofen (ADVIL,MOTRIN) 600 MG tablet Take 1 tablet (600 mg total) by mouth every 6 (six) hours as needed. 05/13/14   Derwood Kaplan, MD  ondansetron (ZOFRAN ODT) 8 MG disintegrating tablet Take 1 tablet (8 mg total) by mouth every 8 (eight) hours as needed for nausea or vomiting. 01/03/16   Payton Mccallum, MD    Family History Family History  Problem Relation Age of Onset  . Hypertension Mother   . Healthy Father     Social History Social History   Tobacco Use  . Smoking status: Never Smoker  . Smokeless tobacco: Never Used  Substance Use Topics  . Alcohol use: No  . Drug use: No     Allergies   Patient has no known allergies.   Review of Systems Review of Systems  Constitutional: Negative.   Respiratory: Positive for shortness of breath.    Physical Exam Triage Vital Signs ED Triage Vitals  Enc Vitals Group     BP 04/12/18 2011 (!) 164/105     Pulse Rate 04/12/18 2011 71     Resp 04/12/18 2011 16     Temp 04/12/18 2011 98.6 F (37 C)     Temp Source 04/12/18 2011 Oral     SpO2 04/12/18 2011 100 %     Weight 04/12/18 2009 170 lb (77.1 kg)     Height 04/12/18 2009 5\' 5"  (1.651 m)     Head Circumference --  Peak Flow --      Pain Score 04/12/18 2009 0     Pain Loc --      Pain Edu? --      Excl. in GC? --    Updated Vital Signs BP (!) 164/105 (BP Location: Left Arm)   Pulse 71   Temp 98.6 F (37 C) (Oral)   Resp 16   Ht 5\' 5"  (1.651 m)   Wt 77.1 kg   LMP 03/19/2018 (Approximate)   SpO2 100%   BMI 28.29 kg/m   Visual Acuity Right Eye Distance:   Left Eye Distance:   Bilateral Distance:    Right Eye Near:   Left Eye Near:    Bilateral Near:     Physical Exam Vitals signs and nursing note reviewed.  Constitutional:      General: She is not in acute distress.    Appearance: Normal appearance.  HENT:     Head: Normocephalic and atraumatic.     Mouth/Throat:     Pharynx: Oropharynx is clear. No oropharyngeal exudate or posterior  oropharyngeal erythema.  Eyes:     General:        Right eye: No discharge.        Left eye: No discharge.     Conjunctiva/sclera: Conjunctivae normal.  Cardiovascular:     Rate and Rhythm: Normal rate and regular rhythm.  Pulmonary:     Effort: Pulmonary effort is normal.     Breath sounds: Normal breath sounds.  Neurological:     Mental Status: She is alert.  Psychiatric:        Mood and Affect: Mood normal.        Behavior: Behavior normal.    UC Treatments / Results  Labs (all labs ordered are listed, but only abnormal results are displayed) Labs Reviewed - No data to display  EKG None  Radiology No results found.  Procedures Procedures (including critical care time)  Medications Ordered in UC Medications - No data to display  Initial Impression / Assessment and Plan / UC Course  I have reviewed the triage vital signs and the nursing notes.  Pertinent labs & imaging results that were available during my care of the patient were reviewed by me and considered in my medical decision making (see chart for details).    43 year old female presents with reported shortness of breath and throat tightness.  She is well-appearing on exam.  This is either viral or it secondary to anxiety induced by possible exposure to coronavirus.  Offered testing and patient declined.  I clinically do not suspect that she has coronavirus.  Albuterol as needed.  Patient is to return or go to the ER if she fails improve or worsens.  Final Clinical Impressions(s) / UC Diagnoses   Final diagnoses:  Viral illness     Discharge Instructions     Albuterol as needed.  If you worsen, please let us know.  Take care   Dr. Adriana Simas    ED Prescriptions    Medication Sig Dispense Auth. Provider   albuterol (PROVENTIL HFA;VENTOLIN HFA) 108 (90 Base) MCG/ACT inhaler Inhale 1-2 puffs into the lungs every 6 (six) hours as needed for wheezing or shortness of breath. 1 Inhaler Tommie Sams, DO      Controlled Substance Prescriptions [place] Controlled Substance Registry consulted? Not Applicable   Tommie Sams, DO 04/12/18 2156

## 2018-04-12 NOTE — Discharge Instructions (Signed)
Albuterol as needed.  If you worsen, please let us know.  Take care   Dr. Adriana Simas

## 2018-11-29 ENCOUNTER — Other Ambulatory Visit: Payer: Self-pay

## 2018-11-29 DIAGNOSIS — Z20822 Contact with and (suspected) exposure to covid-19: Secondary | ICD-10-CM

## 2018-11-30 LAB — NOVEL CORONAVIRUS, NAA: SARS-CoV-2, NAA: NOT DETECTED

## 2019-02-07 ENCOUNTER — Ambulatory Visit: Payer: Federal, State, Local not specified - PPO | Attending: Internal Medicine

## 2019-02-07 DIAGNOSIS — Z20822 Contact with and (suspected) exposure to covid-19: Secondary | ICD-10-CM

## 2019-02-09 LAB — NOVEL CORONAVIRUS, NAA: SARS-CoV-2, NAA: NOT DETECTED

## 2019-07-11 ENCOUNTER — Emergency Department (HOSPITAL_COMMUNITY)
Admission: EM | Admit: 2019-07-11 | Discharge: 2019-07-11 | Disposition: A | Discharge status: Home / Self Care | Payer: Federal, State, Local not specified - PPO | Attending: Emergency Medicine | Admitting: Emergency Medicine

## 2019-07-11 ENCOUNTER — Other Ambulatory Visit: Payer: Self-pay

## 2019-07-11 ENCOUNTER — Emergency Department (HOSPITAL_COMMUNITY): Payer: Federal, State, Local not specified - PPO

## 2019-07-11 DIAGNOSIS — I10 Essential (primary) hypertension: Secondary | ICD-10-CM | POA: Insufficient documentation

## 2019-07-11 DIAGNOSIS — R0789 Other chest pain: Secondary | ICD-10-CM | POA: Diagnosis present

## 2019-07-11 DIAGNOSIS — Z79899 Other long term (current) drug therapy: Secondary | ICD-10-CM | POA: Diagnosis not present

## 2019-07-11 LAB — CBC
HCT: 41.8 % (ref 36.0–46.0)
Hemoglobin: 12.9 g/dL (ref 12.0–15.0)
MCH: 29.7 pg (ref 26.0–34.0)
MCHC: 30.9 g/dL (ref 30.0–36.0)
MCV: 96.1 fL (ref 80.0–100.0)
Platelets: 241 10*3/uL (ref 150–400)
RBC: 4.35 MIL/uL (ref 3.87–5.11)
RDW: 12.1 % (ref 11.5–15.5)
WBC: 6 10*3/uL (ref 4.0–10.5)
nRBC: 0 % (ref 0.0–0.2)

## 2019-07-11 LAB — BASIC METABOLIC PANEL
Anion gap: 8 (ref 5–15)
BUN: 12 mg/dL (ref 6–20)
CO2: 24 mmol/L (ref 22–32)
Calcium: 10.8 mg/dL — ABNORMAL HIGH (ref 8.9–10.3)
Chloride: 106 mmol/L (ref 98–111)
Creatinine, Ser: 0.78 mg/dL (ref 0.44–1.00)
GFR calc Af Amer: >60 mL/min (ref >60)
GFR calc non Af Amer: >60 mL/min (ref >60)
Glucose, Bld: 123 mg/dL — ABNORMAL HIGH (ref 70–99)
Potassium: 3.8 mmol/L (ref 3.5–5.1)
Sodium: 138 mmol/L (ref 135–145)

## 2019-07-11 LAB — I-STAT BETA HCG BLOOD, ED (MC, WL, AP ONLY): I-stat hCG, quantitative: <5 m[IU]/mL (ref <5)

## 2019-07-11 LAB — TROPONIN I (HIGH SENSITIVITY)
Troponin I (High Sensitivity): 2 ng/L (ref <18)
Troponin I (High Sensitivity): 2 ng/L (ref <18)

## 2019-07-11 MED ORDER — SODIUM CHLORIDE 0.9% FLUSH: 3.0000 mL | Freq: Once | INTRAVENOUS | Status: DC

## 2019-07-11 NOTE — ED Provider Notes (Signed)
[facility] EMERGENCY DEPARTMENT Provider Note   CSN: [id number] Arrival date & time: 07/11/19  0348     History Chief Complaint  Patient presents with   Chest Pain    Catherine Parsons is a 44 y.o. female.  The history is provided by the patient.  Chest Pain Pain location:  Substernal area Pain quality: aching   Pain radiates to:  Does not radiate Pain severity:  Mild Timing:  Constant Progression:  Unchanged Chronicity:  New Context: stress   Relieved by:  Nothing Worsened by:  Nothing Ineffective treatments:  None tried Associated symptoms: no abdominal pain, no back pain, no cough, no fever, no palpitations, no shortness of breath and no vomiting   Risk factors: hypertension   Risk factors: no coronary artery disease, no diabetes mellitus, no high cholesterol and no prior DVT/PE        Past Medical History:  Diagnosis Date   Hypertension     Patient Active Problem List   Diagnosis Date Noted   Hypercalcemia 11/11/2013   Low TSH level 11/11/2013    No past surgical history on file.   OB History   No obstetric history on file.     Family History  Problem Relation Age of Onset   Hypertension Mother    Healthy Father     Social History   Tobacco Use   Smoking status: Never Smoker   Smokeless tobacco: Never Used  Scientific laboratory technician Use: Never used  Substance Use Topics   Alcohol use: No   Drug use: No    Home Medications Prior to Admission medications   Medication Sig Start Date End Date Taking? Authorizing Provider  lisinopril (PRINIVIL,ZESTRIL) 10 MG tablet Take 10 mg by mouth at bedtime.  10/24/13  Yes [provider]  metoprolol succinate (TOPROL-XL) 50 MG 24 hr tablet Take 50 mg by mouth daily. Take with or immediately following a meal.   Yes [provider]  naproxen sodium (ALEVE) 220 MG tablet Take 220 mg by mouth 2 (two) times daily as needed (pain).   Yes [provider]   acetaminophen (TYLENOL) 500 MG tablet Take 500 mg by mouth 2 (two) times daily as needed (pain).    [provider]  albuterol (PROVENTIL HFA;VENTOLIN HFA) 108 (90 Base) MCG/ACT inhaler Inhale 1-2 puffs into the lungs every 6 (six) hours as needed for wheezing or shortness of breath. 04/12/18   Coral Spikes, DO  ibuprofen (ADVIL,MOTRIN) 600 MG tablet Take 1 tablet (600 mg total) by mouth every 6 (six) hours as needed. 05/13/14   Varney Biles, MD  ondansetron (ZOFRAN ODT) 8 MG disintegrating tablet Take 1 tablet (8 mg total) by mouth every 8 (eight) hours as needed for nausea or vomiting. Patient not taking: Reported on 07/11/2019 01/03/16   Norval Gable, MD    Allergies    Patient has no known allergies.  Review of Systems   Review of Systems  Constitutional: Negative for chills and fever.  HENT: Negative for ear pain and sore throat.   Eyes: Negative for pain and visual disturbance.  Respiratory: Negative for cough and shortness of breath.   Cardiovascular: Positive for chest pain. Negative for palpitations.  Gastrointestinal: Negative for abdominal pain and vomiting.  Genitourinary: Negative for dysuria and hematuria.  Musculoskeletal: Negative for arthralgias and back pain.  Skin: Negative for color change and rash.  Neurological: Negative for seizures and syncope.  All other systems reviewed and are negative.  Physical Exam Updated Vital Signs  ED Triage Vitals  Enc Vitals Group     BP 07/11/19 0350 (!) 160/106     Pulse Rate 07/11/19 0350 69     Resp 07/11/19 0350 16     Temp 07/11/19 0350 98 F (36.7 C)     Temp Source 07/11/19 0350 Oral     SpO2 07/11/19 0350 100 %     Weight --      Height --      Head Circumference --      Peak Flow --      Pain Score 07/11/19 0351 4     Pain Loc --      Pain Edu? --      Excl. in GC? --     Physical Exam Vitals and nursing note reviewed.  Constitutional:      General: She is not in acute distress.     Appearance: She is well-developed. She is not ill-appearing.  HENT:     Head: Normocephalic and atraumatic.  Eyes:     Extraocular Movements: Extraocular movements intact.     Conjunctiva/sclera: Conjunctivae normal.     Pupils: Pupils are equal, round, and reactive to light.  Cardiovascular:     Rate and Rhythm: Normal rate and regular rhythm.     Pulses:          Radial pulses are 2+ on the right side and 2+ on the left side.       Dorsalis pedis pulses are 2+ on the right side and 2+ on the left side.     Heart sounds: Normal heart sounds. No murmur heard.   Pulmonary:     Effort: Pulmonary effort is normal. No respiratory distress.     Breath sounds: Normal breath sounds. No decreased breath sounds, wheezing or rhonchi.  Abdominal:     Palpations: Abdomen is soft.     Tenderness: There is no abdominal tenderness.  Musculoskeletal:        General: Normal range of motion.     Cervical back: Neck supple.     Right lower leg: No edema.     Left lower leg: No edema.  Skin:    General: Skin is warm and dry.     Capillary Refill: Capillary refill takes less than 2 seconds.  Neurological:     General: No focal deficit present.     Mental Status: She is alert.     ED Results / Procedures / Treatments   Labs (all labs ordered are listed, but only abnormal results are displayed) Labs Reviewed  BASIC METABOLIC PANEL - Abnormal; Notable for the following components:      Result Value   Glucose, Bld 123 (*)    Calcium 10.8 (*)    All other components within normal limits  CBC  I-STAT BETA HCG BLOOD, ED ([facility], [facility], [facility] ONLY)  TROPONIN I (HIGH SENSITIVITY)  TROPONIN I (HIGH SENSITIVITY)    EKG EKG Interpretation  Date/Time:  Friday July 11 2019 03:49:30 EDT Ventricular Rate:  65 PR Interval:  182 QRS Duration: 82 QT Interval:  378 QTC Calculation: 393 R Axis:   67 Text Interpretation: Normal sinus rhythm with sinus arrhythmia Normal ECG Confirmed by Virgina Norfolk [phone])  on 07/11/2019 7:45:53 AM   Radiology DG Chest 2 View  Result Date: 07/11/2019 CLINICAL DATA:  Chest pain radiating to right side EXAM: CHEST - 2 VIEW COMPARISON:  Radiograph 03/08/2013 FINDINGS: No consolidation, features of edema, pneumothorax,  or effusion. Pulmonary vascularity is normally distributed. The cardiomediastinal contours are unremarkable. No acute osseous or soft tissue abnormality. IMPRESSION: No acute cardiopulmonary or chest wall abnormality. Electronically Signed   By: Kreg Shropshire M.D.   On: 07/11/2019 04:40    Procedures Procedures (including critical care time)  Medications Ordered in ED Medications  sodium chloride flush (NS) 0.9 % injection 3 mL (3 mLs Intravenous Not Given 07/11/19 6837)    ED Course  I have reviewed the triage vital signs and the nursing notes.  Pertinent labs & imaging results that were available during my care of the patient were reviewed by me and considered in my medical decision making (see chart for details).    MDM Rules/Calculators/A&P                          Catherine Parsons is a 44 year old female presents to the ED with chest pain.  Patient with normal vitals.  No fever.  Overall unremarkable vitals.  Chest pain with some sweatiness and anxiousness.  She states that she felt like maybe she was having a panic attack.  Has been dealing with stress recently.  EKG shows sinus rhythm.  No ischemic changes.  Troponin negative x2.  Doubt ACS.  Low heart score.  Chest x-ray showed no signs of pneumonia, no pneumothorax, no pleural effusion.  Patient is PERC negative and doubt PE.  No abdominal pain.  No shortness of breath.  No current chest pain and symptoms have resolved.  Overall gave patient reassurance.  Recommend follow-up with primary care doctor and discharged in ED in good condition.  This chart was dictated using voice recognition software.  Despite best efforts to proofread,  errors can occur which can change the documentation meaning.     Final Clinical Impression(s) / ED Diagnoses Final diagnoses:  Atypical chest pain    Rx / DC Orders ED Discharge Orders    None       Virgina Norfolk, DO 07/11/19 [phone]

## 2019-07-11 NOTE — ED Triage Notes (Signed)
Per pt tonight she said her mid chest area began to hurt with some clammy and sweaty.No n/v no sob.  Pt took a shower and then felt somewhat better. Pt said went to sleep and then woke up with diarrhea. Pt said does not hurt when she inhales only when she exhales.

## 2019-11-15 ENCOUNTER — Ambulatory Visit: Payer: Federal, State, Local not specified - PPO | Attending: Internal Medicine

## 2019-11-15 DIAGNOSIS — Z23 Encounter for immunization: Secondary | ICD-10-CM

## 2019-11-15 NOTE — Progress Notes (Signed)
   Covid-[facility] Vaccination [facility]  Name:  SANSKRITI GREENLAW    MRN: [id number] DOB: 03-27-1975  11/15/2019  Ms. Rudge was observed post Covid-19 immunization for 15 minutes without incident. She was provided with Vaccine Information Sheet and instruction to access the V-Safe system.   Ms. Heideman was instructed to call 911 with any severe reactions post vaccine: Marland Kitchen Difficulty breathing  . Swelling of face and throat  . A fast heartbeat  . A bad rash all over body  . Dizziness and weakness

## 2020-03-02 ENCOUNTER — Encounter (INDEPENDENT_AMBULATORY_CARE_PROVIDER_SITE_OTHER): Payer: Self-pay | Admitting: Otolaryngology

## 2020-03-02 ENCOUNTER — Ambulatory Visit (INDEPENDENT_AMBULATORY_CARE_PROVIDER_SITE_OTHER): Payer: 59 | Admitting: Otolaryngology

## 2020-03-02 ENCOUNTER — Other Ambulatory Visit: Payer: Self-pay

## 2020-03-02 VITALS — Temp 98.1°F

## 2020-03-02 DIAGNOSIS — K219 Gastro-esophageal reflux disease without esophagitis: Secondary | ICD-10-CM | POA: Diagnosis not present

## 2020-03-02 NOTE — Progress Notes (Signed)
HPI: Catherine Parsons is a 45 y.o. female who presents for evaluation of throat complaints.  She feels like she has a "clogged" throat with mucus or lump stuck in her throat at the approximate level of the laryngeal cartilage.  She has no problems swallowing or eating.  No pain.  She frequently sucks on cough drops and drinks water to help some with the throat symptoms.  She feels like it may be "swollen".  She has had occasional reflux symptoms but not regular reflux symptoms. She does not smoke.  Past Medical History:  Diagnosis Date  . Hypertension    No past surgical history on file. Social History   Socioeconomic History  . Marital status: Married    Spouse name: Not on file  . Number of children: Not on file  . Years of education: Not on file  . Highest education level: Not on file  Occupational History  . Not on file  Tobacco Use  . Smoking status: Never Smoker  . Smokeless tobacco: Never Used  Vaping Use  . Vaping Use: Never used  Substance and Sexual Activity  . Alcohol use: No  . Drug use: No  . Sexual activity: Not on file  Other Topics Concern  . Not on file  Social History Narrative  . Not on file   Social Determinants of Health   Financial Resource Strain: Not on file  Food Insecurity: Not on file  Transportation Needs: Not on file  Physical Activity: Not on file  Stress: Not on file  Social Connections: Not on file   Family History  Problem Relation Age of Onset  . Hypertension Mother   . Healthy Father    No Known Allergies Prior to Admission medications   Medication Sig Start Date End Date Taking? Authorizing Provider  acetaminophen (TYLENOL) 500 MG tablet Take 500 mg by mouth 2 (two) times daily as needed (pain).    [provider]  albuterol (PROVENTIL HFA;VENTOLIN HFA) 108 (90 Base) MCG/ACT inhaler Inhale 1-2 puffs into the lungs every 6 (six) hours as needed for wheezing or shortness of breath. Patient not taking: Reported on 07/11/2019  04/12/18   Tommie Sams, DO  amLODipine (NORVASC) 5 MG tablet Take 5 mg by mouth daily. 04/19/19   [provider]  ibuprofen (ADVIL,MOTRIN) 600 MG tablet Take 1 tablet (600 mg total) by mouth every 6 (six) hours as needed. Patient not taking: Reported on 07/11/2019 05/13/14   Derwood Kaplan, MD  lisinopril (ZESTRIL) 40 MG tablet Take 40 mg by mouth daily. 06/25/19   [provider]  metoprolol succinate (TOPROL-XL) 25 MG 24 hr tablet Take 25 mg by mouth daily. 04/20/19   [provider]  naproxen sodium (ALEVE) 220 MG tablet Take 220 mg by mouth 2 (two) times daily as needed (pain).    [provider]  ondansetron (ZOFRAN ODT) 8 MG disintegrating tablet Take 1 tablet (8 mg total) by mouth every 8 (eight) hours as needed for nausea or vomiting. Patient not taking: Reported on 07/11/2019 01/03/16   Payton Mccallum, MD  traZODone (DESYREL) 50 MG tablet Take 50-100 mg by mouth at bedtime as needed for sleep. 06/27/19   [provider]  Vitamin D, Ergocalciferol, (DRISDOL) 1.25 MG (50000 UNIT) CAPS capsule Take 50,000 Units by mouth once a week. 06/03/19   [provider]     Positive ROS: Otherwise negative  All other systems have been reviewed and were otherwise negative with the exception of those  mentioned in the HPI and as above.  Physical Exam: Constitutional: Alert, well-appearing, no acute distress Ears: External ears without lesions or tenderness. Ear canals are clear bilaterally with intact, clear TMs.  Nasal: External nose without lesions. Septum slightly deviated to the right with mild rhinitis.  Both middle meatus regions are clear.  No signs of infection..  Oral: Lips and gums without lesions. Tongue and palate mucosa without lesions. Posterior oropharynx clear.  Tonsil regions are symmetric and benign in appearance. Fiberoptic laryngoscopy was performed through the left nostril.  The nasopharynx was clear.  The base of tongue vallecula and  epiglottis were normal.  Vocal cords were clear bilaterally with normal vocal mobility.  Both piriform sinuses were clear.  She had mild arytenoid mucosal edema consistent with possible reflux.  But no erythema or mucosal lesions noted otherwise. Neck: No palpable adenopathy or masses.  No adenopathy on either side of the neck.  No thyroid nodules noted. Respiratory: Breathing comfortably  Skin: No facial/neck lesions or rash noted.  Laryngoscopy  Date/Time: 03/02/2020 5:19 PM Performed by: Drema Halon, MD Authorized by: Drema Halon, MD   Consent:    Consent obtained:  Verbal   Consent given by:  Patient Procedure details:    Indications: direct visualization of the upper aerodigestive tract     Medication:  Afrin   Instrument: flexible fiberoptic laryngoscope     Scope location: left nare   Sinus:    Left middle meatus: normal     Left nasopharynx: normal   Mouth:    Oropharynx: normal     Vallecula: normal     Base of tongue: normal     Epiglottis: normal   Throat:    Pyriform sinus: normal     True vocal cords: normal   Comments:     On fiberoptic laryngoscopy the hypopharynx and larynx were clear.  Vocal cords were normal with normal vocal cord mobility.  Had mild arytenoid edema consistent with possible laryngeal pharyngeal reflux.    Assessment: Throat symptoms probably related to laryngeal pharyngeal reflux.  Plan: Prescribed omeprazole 40 mg daily before dinner for 1 month with 1 refill. She will notify us if symptoms have not resolved within 6 weeks.  Narda Bonds, MD

## 2020-04-30 ENCOUNTER — Other Ambulatory Visit (INDEPENDENT_AMBULATORY_CARE_PROVIDER_SITE_OTHER): Payer: Self-pay | Admitting: Otolaryngology

## 2020-05-04 ENCOUNTER — Other Ambulatory Visit (INDEPENDENT_AMBULATORY_CARE_PROVIDER_SITE_OTHER): Payer: Self-pay

## 2020-05-04 MED ORDER — OMEPRAZOLE 40 MG PO CPDR: 40.0000 mg | DELAYED_RELEASE_CAPSULE | Freq: Every day | ORAL | 2 refills | Status: DC

## 2020-05-21 ENCOUNTER — Ambulatory Visit (INDEPENDENT_AMBULATORY_CARE_PROVIDER_SITE_OTHER): Payer: 59 | Admitting: Otolaryngology

## 2020-05-21 ENCOUNTER — Other Ambulatory Visit: Payer: Self-pay

## 2020-05-21 ENCOUNTER — Encounter (INDEPENDENT_AMBULATORY_CARE_PROVIDER_SITE_OTHER): Payer: Self-pay | Admitting: Otolaryngology

## 2020-05-21 VITALS — Temp 97.3°F

## 2020-05-21 DIAGNOSIS — K219 Gastro-esophageal reflux disease without esophagitis: Secondary | ICD-10-CM

## 2020-05-21 DIAGNOSIS — J310 Chronic rhinitis: Secondary | ICD-10-CM

## 2020-05-21 NOTE — Progress Notes (Signed)
HPI: Catherine Parsons is a 45 y.o. female who returns today for evaluation of throat complaints.  She still feels like she has some chronic mucus or fullness in her throat at the approximate level of the cricoid and laryngeal cartilages.  Symptoms which are generally typical of reflux disease.  She has been on Nexium as well as omeprazole but still feels like she has symptoms.  She denies any nasal discharge although she has intermittent nasal congestion. She has no trouble eating or swallowing.  No sore throat.  And no hoarseness..  Past Medical History:  Diagnosis Date  . Hypertension    No past surgical history on file. Social History   Socioeconomic History  . Marital status: Married    Spouse name: Not on file  . Number of children: Not on file  . Years of education: Not on file  . Highest education level: Not on file  Occupational History  . Not on file  Tobacco Use  . Smoking status: Never Smoker  . Smokeless tobacco: Never Used  Vaping Use  . Vaping Use: Never used  Substance and Sexual Activity  . Alcohol use: No  . Drug use: No  . Sexual activity: Not on file  Other Topics Concern  . Not on file  Social History Narrative  . Not on file   Social Determinants of Health   Financial Resource Strain: Not on file  Food Insecurity: Not on file  Transportation Needs: Not on file  Physical Activity: Not on file  Stress: Not on file  Social Connections: Not on file   Family History  Problem Relation Age of Onset  . Hypertension Mother   . Healthy Father    No Known Allergies Prior to Admission medications   Medication Sig Start Date End Date Taking? Authorizing Provider  acetaminophen (TYLENOL) 500 MG tablet Take 500 mg by mouth 2 (two) times daily as needed (pain).    [provider]  albuterol (PROVENTIL HFA;VENTOLIN HFA) 108 (90 Base) MCG/ACT inhaler Inhale 1-2 puffs into the lungs every 6 (six) hours as needed for wheezing or shortness of breath. Patient  not taking: Reported on 07/11/2019 04/12/18   Tommie Sams, DO  amLODipine (NORVASC) 5 MG tablet Take 5 mg by mouth daily. 04/19/19   [provider]  ibuprofen (ADVIL,MOTRIN) 600 MG tablet Take 1 tablet (600 mg total) by mouth every 6 (six) hours as needed. Patient not taking: Reported on 07/11/2019 05/13/14   Derwood Kaplan, MD  lisinopril (ZESTRIL) 40 MG tablet Take 40 mg by mouth daily. 06/25/19   [provider]  metoprolol succinate (TOPROL-XL) 25 MG 24 hr tablet Take 25 mg by mouth daily. 04/20/19   [provider]  naproxen sodium (ALEVE) 220 MG tablet Take 220 mg by mouth 2 (two) times daily as needed (pain).    [provider]  omeprazole (PRILOSEC) 40 MG capsule TAKE 1 CAPSULE BY MOUTH EVERY DAY BEFORE DINNER 04/30/20   Drema Halon, MD  omeprazole (PRILOSEC) 40 MG capsule Take 1 capsule (40 mg total) by mouth daily. Take 1 caps. daily before dinner 05/04/20   Drema Halon, MD  ondansetron (ZOFRAN ODT) 8 MG disintegrating tablet Take 1 tablet (8 mg total) by mouth every 8 (eight) hours as needed for nausea or vomiting. Patient not taking: Reported on 07/11/2019 01/03/16   Payton Mccallum, MD  traZODone (DESYREL) 50 MG tablet Take 50-100 mg by mouth at bedtime as needed for sleep. 06/27/19  [provider]  Vitamin D, Ergocalciferol, (DRISDOL) 1.25 MG (50000 UNIT) CAPS capsule Take 50,000 Units by mouth once a week. 06/03/19   [provider]     Positive ROS: Otherwise negative  All other systems have been reviewed and were otherwise negative with the exception of those mentioned in the HPI and as above.  Physical Exam: Constitutional: Alert, well-appearing, no acute distress Ears: External ears without lesions or tenderness. Ear canals are clear bilaterally with intact, clear TMs.  Nasal: External nose without lesions. Septum relatively midline with mild rhinitis.. Clear nasal passages bilaterally with no signs of infection.   No polyps.  Minimal clear mucus. Oral: Lips and gums without lesions. Tongue and palate mucosa without lesions. Posterior oropharynx clear.  Tonsils are small and symmetric bilaterally.  No postnasal drainage noted.  Indirect laryngoscopy revealed a clear base of tongue vallecula and epiglottis.  Vocal cords were clear bilaterally.  Piriform sinuses were clear. Neck: No palpable adenopathy or masses. Respiratory: Breathing comfortably  Skin: No facial/neck lesions or rash noted.  Procedures  Assessment: Globus type symptoms I suspect are probably related to laryngeal pharyngeal reflux.  She has used antacid medication with minimal benefit.  Plan: I reviewed with her concerning reflux disease and globus type symptoms she experienced. Suggested perhaps discontinuing the antiacid medication to see if symptoms worsen. Also suggested use of Flonase 2 sprays each nostril for any nasal congestion as she has mild rhinitis. If symptoms with her throat persist would recommend further evaluation with gastroenterology.   Narda Bonds, MD

## 2020-07-29 ENCOUNTER — Other Ambulatory Visit (INDEPENDENT_AMBULATORY_CARE_PROVIDER_SITE_OTHER): Payer: Self-pay | Admitting: Otolaryngology

## 2020-11-05 IMAGING — CR DG CHEST 2V
2 series · 2 of 2 positions shown · non-contrast
Comparison: Radiograph 03/08/2013

CLINICAL DATA: Chest pain radiating to right side

EXAM:
CHEST - 2 VIEW

[chest pa]
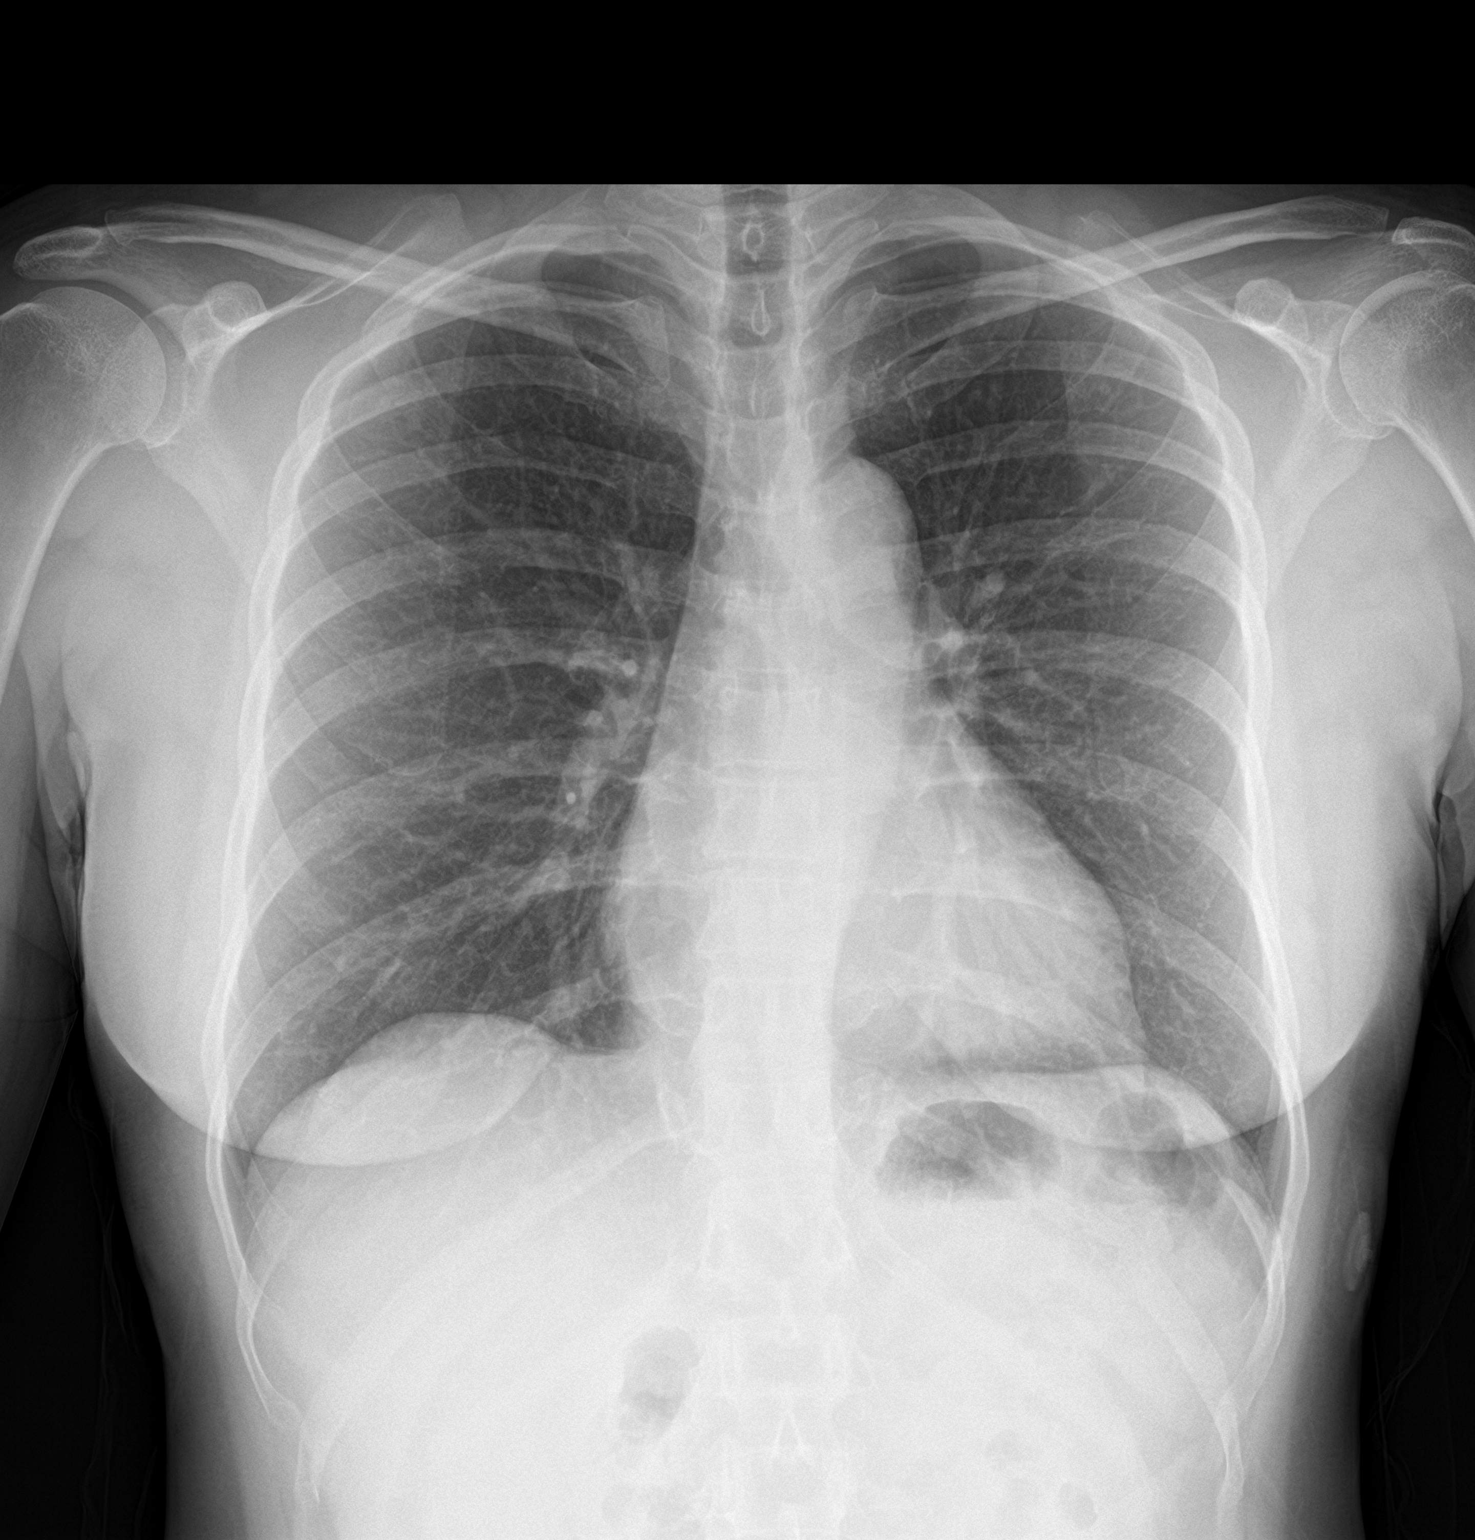

[chest lat]
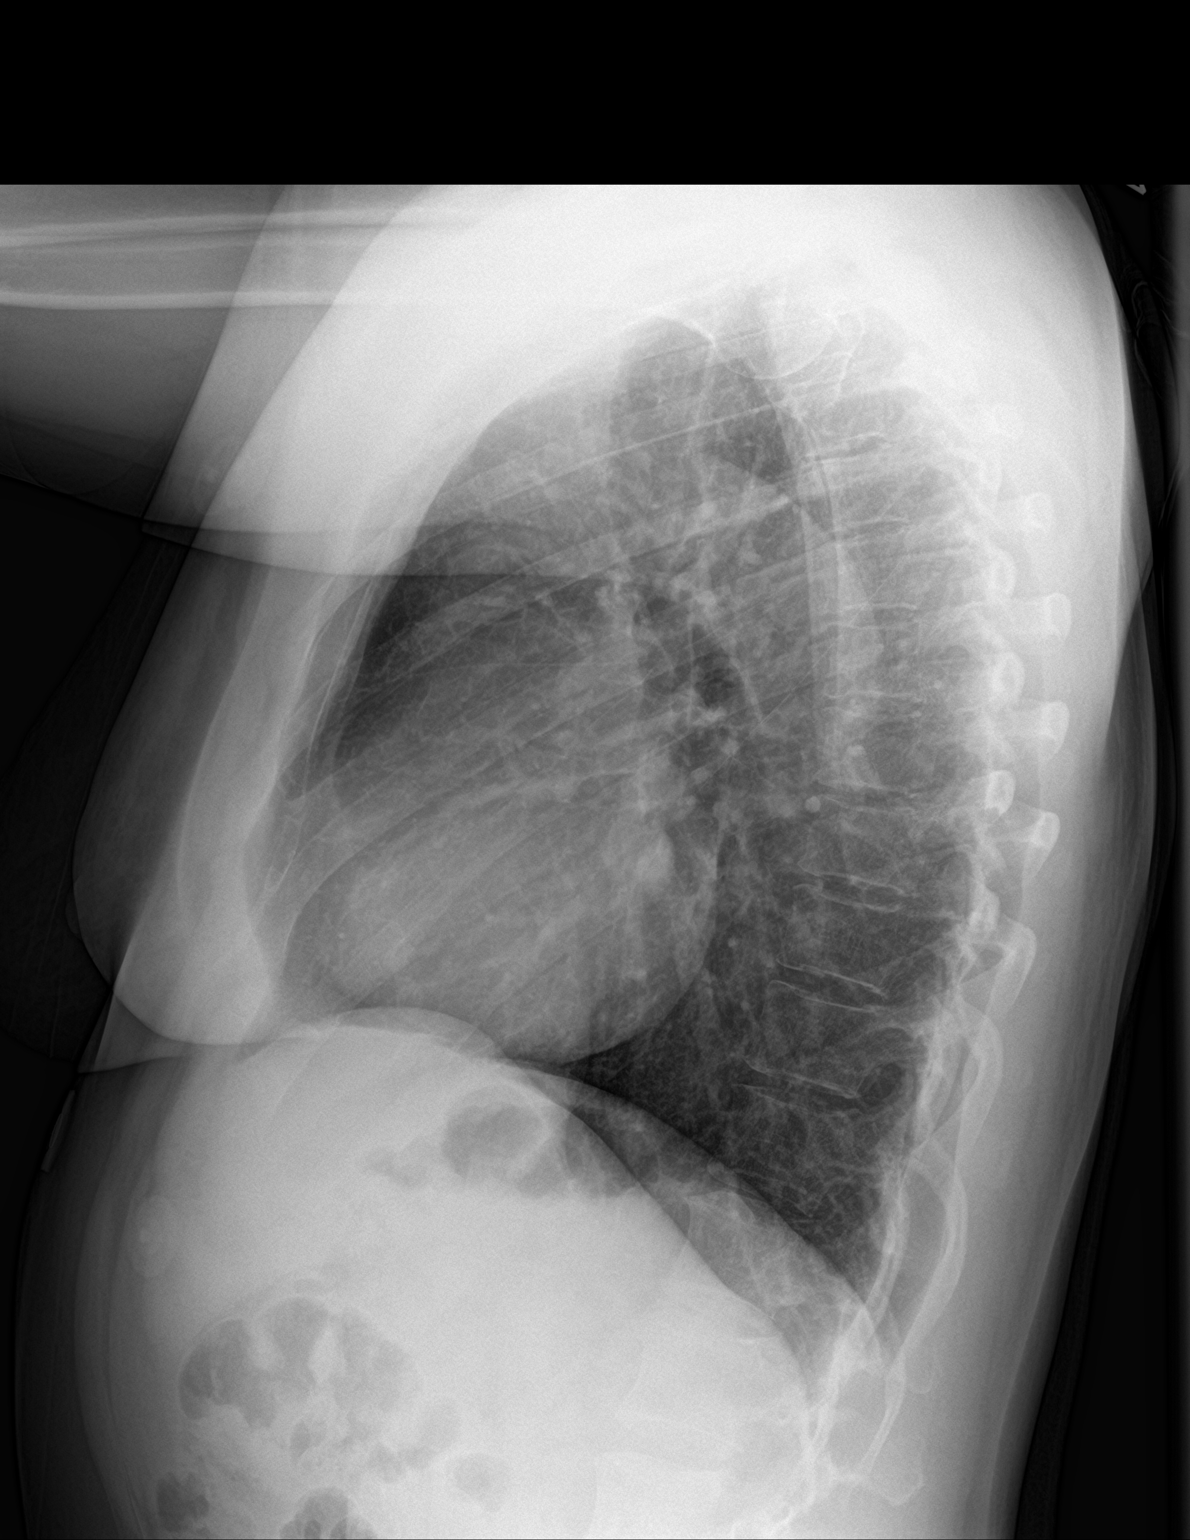

[2 of 2 positions shown; findings below may reference images not displayed]

FINDINGS: No consolidation, features of edema, pneumothorax, or effusion.
Pulmonary vascularity is normally distributed. The cardiomediastinal
contours are unremarkable. No acute osseous or soft tissue
abnormality.
IMPRESSION: No acute cardiopulmonary or chest wall abnormality.

## 2021-04-25 ENCOUNTER — Encounter (HOSPITAL_COMMUNITY): Payer: Self-pay | Admitting: Emergency Medicine

## 2021-04-25 ENCOUNTER — Emergency Department (HOSPITAL_COMMUNITY): Payer: 59

## 2021-04-25 ENCOUNTER — Other Ambulatory Visit: Payer: Self-pay

## 2021-04-25 ENCOUNTER — Emergency Department (HOSPITAL_COMMUNITY)
Admission: EM | Admit: 2021-04-25 | Discharge: 2021-04-25 | Disposition: A | Discharge status: Home / Self Care | Payer: 59 | Attending: Emergency Medicine | Admitting: Emergency Medicine

## 2021-04-25 DIAGNOSIS — R079 Chest pain, unspecified: Secondary | ICD-10-CM

## 2021-04-25 DIAGNOSIS — M25511 Pain in right shoulder: Secondary | ICD-10-CM | POA: Diagnosis not present

## 2021-04-25 DIAGNOSIS — R0789 Other chest pain: Secondary | ICD-10-CM | POA: Diagnosis present

## 2021-04-25 LAB — CBC
HCT: 36.7 % (ref 36.0–46.0)
Hemoglobin: 11.3 g/dL — ABNORMAL LOW (ref 12.0–15.0)
MCH: 26.6 pg (ref 26.0–34.0)
MCHC: 30.8 g/dL (ref 30.0–36.0)
MCV: 86.4 fL (ref 80.0–100.0)
Platelets: 259 10*3/uL (ref 150–400)
RBC: 4.25 MIL/uL (ref 3.87–5.11)
RDW: 12.8 % (ref 11.5–15.5)
WBC: 4 10*3/uL (ref 4.0–10.5)
nRBC: 0 % (ref 0.0–0.2)

## 2021-04-25 LAB — TROPONIN I (HIGH SENSITIVITY)
Troponin I (High Sensitivity): <2 ng/L (ref <18)
Troponin I (High Sensitivity): 2 ng/L (ref <18)

## 2021-04-25 LAB — COMPREHENSIVE METABOLIC PANEL
ALT: 15 U/L (ref 0–44)
AST: 14 U/L — ABNORMAL LOW (ref 15–41)
Albumin: 3.8 g/dL (ref 3.5–5.0)
Alkaline Phosphatase: 37 U/L — ABNORMAL LOW (ref 38–126)
Anion gap: 7 (ref 5–15)
BUN: 11 mg/dL (ref 6–20)
CO2: 22 mmol/L (ref 22–32)
Calcium: 9.3 mg/dL (ref 8.9–10.3)
Chloride: 109 mmol/L (ref 98–111)
Creatinine, Ser: 0.75 mg/dL (ref 0.44–1.00)
GFR, Estimated: >60 mL/min (ref >60)
Glucose, Bld: 104 mg/dL — ABNORMAL HIGH (ref 70–99)
Potassium: 3.7 mmol/L (ref 3.5–5.1)
Sodium: 138 mmol/L (ref 135–145)
Total Bilirubin: 0.4 mg/dL (ref 0.3–1.2)
Total Protein: 7.1 g/dL (ref 6.5–8.1)

## 2021-04-25 LAB — I-STAT BETA HCG BLOOD, ED (MC, WL, AP ONLY): I-stat hCG, quantitative: <5 m[IU]/mL (ref <5)

## 2021-04-25 MED ORDER — SUCRALFATE 1 G PO TABS: 1.0000 g | ORAL_TABLET | Freq: Three times a day (TID) | ORAL | 0 refills | Status: AC

## 2021-04-25 NOTE — ED Provider Notes (Signed)
?[facility] EMERGENCY DEPARTMENT ?Provider Note ? ? ?CSN: [id number] ?Arrival date & time: 04/25/21  1013 ? ?  ? ?History ? ?Chief Complaint  ?Patient presents with  ? Chest Pain  ? ? ?Catherine Parsons is a 46 y.o. female. ? ?HPI ?Patient presents for evaluation of chest discomfort.  She complains of right-sided chest and shoulder pain.  The pain comes and goes.  She has been using over-the-counter antacids with partial relief.  Symptoms onset 2 weeks ago without trauma.  No cough, shortness of breath, weakness or dizziness.  She is concerned that she has an upcoming trip out of the country for a cruise planned.  She wants to make sure that she is doing okay.  No prior similar problems.  She does not take oral contraceptives. ?  ? ?Home Medications ?Prior to Admission medications   ?Medication Sig Start Date End Date Taking? Authorizing Provider  ?acetaminophen (TYLENOL) 500 MG tablet Take 500 mg by mouth 2 (two) times daily as needed (pain).    [provider]  ?albuterol (PROVENTIL HFA;VENTOLIN HFA) 108 (90 Base) MCG/ACT inhaler Inhale 1-2 puffs into the lungs every 6 (six) hours as needed for wheezing or shortness of breath. ?Patient not taking: Reported on 07/11/2019 04/12/18   Tommie Sams, DO  ?amLODipine (NORVASC) 5 MG tablet Take 5 mg by mouth daily. 04/19/19   [provider]  ?ibuprofen (ADVIL,MOTRIN) 600 MG tablet Take 1 tablet (600 mg total) by mouth every 6 (six) hours as needed. ?Patient not taking: Reported on 07/11/2019 05/13/14   Derwood Kaplan, MD  ?lisinopril (ZESTRIL) 40 MG tablet Take 40 mg by mouth daily. 06/25/19   [provider]  ?metoprolol succinate (TOPROL-XL) 25 MG 24 hr tablet Take 25 mg by mouth daily. 04/20/19   [provider]  ?naproxen sodium (ALEVE) 220 MG tablet Take 220 mg by mouth 2 (two) times daily as needed (pain).    [provider]  ?omeprazole (PRILOSEC) 40 MG capsule TAKE 1 CAPSULE BY MOUTH EVERY DAY BEFORE DINNER  04/30/20   Drema Halon, MD  ?omeprazole (PRILOSEC) 40 MG capsule TAKE 1 CAPSULE (40 MG TOTAL) BY MOUTH DAILY BEFORE DINNER 08/16/20   Drema Halon, MD  ?ondansetron (ZOFRAN ODT) 8 MG disintegrating tablet Take 1 tablet (8 mg total) by mouth every 8 (eight) hours as needed for nausea or vomiting. ?Patient not taking: Reported on 07/11/2019 01/03/16   Payton Mccallum, MD  ?traZODone (DESYREL) 50 MG tablet Take 50-100 mg by mouth at bedtime as needed for sleep. 06/27/19   [provider]  ?Vitamin D, Ergocalciferol, (DRISDOL) 1.25 MG (50000 UNIT) CAPS capsule Take 50,000 Units by mouth once a week. 06/03/19   [provider]  ?   ? ?Allergies    ?Patient has no known allergies.   ? ?Review of Systems   ?Review of Systems ? ?Physical Exam ?Updated Vital Signs ?BP (!) 147/118   Pulse 86   Temp 99.7 ?F (37.6 ?C)   Resp 18   SpO2 98%  ?Physical Exam ?Vitals and nursing note reviewed.  ?Constitutional:   ?   Appearance: She is well-developed. She is not ill-appearing.  ?HENT:  ?   Head: Normocephalic and atraumatic.  ?   Right Ear: External ear normal.  ?   Left Ear: External ear normal.  ?Eyes:  ?   Conjunctiva/sclera: Conjunctivae normal.  ?   Pupils: Pupils are equal, round, and reactive to light.  ?Neck:  ?  Trachea: Phonation normal.  ?Cardiovascular:  ?   Rate and Rhythm: Normal rate and regular rhythm.  ?   Heart sounds: Normal heart sounds.  ?Pulmonary:  ?   Effort: Pulmonary effort is normal.  ?   Breath sounds: Normal breath sounds.  ?Chest:  ?   Chest wall: No tenderness.  ?Abdominal:  ?   General: There is no distension.  ?   Palpations: Abdomen is soft.  ?   Tenderness: There is no abdominal tenderness.  ?Musculoskeletal:     ?   General: Normal range of motion.  ?   Cervical back: Normal range of motion and neck supple.  ?Skin: ?   General: Skin is warm and dry.  ?Neurological:  ?   Mental Status: She is alert and oriented to person, place, and time.  ?   Cranial Nerves: No  cranial nerve deficit.  ?   Sensory: No sensory deficit.  ?   Motor: No abnormal muscle tone.  ?   Coordination: Coordination normal.  ?Psychiatric:     ?   Mood and Affect: Mood normal.     ?   Behavior: Behavior normal.     ?   Thought Content: Thought content normal.     ?   Judgment: Judgment normal.  ? ? ?ED Results / Procedures / Treatments   ?Labs ?(all labs ordered are listed, but only abnormal results are displayed) ?Labs Reviewed  ?CBC  ?COMPREHENSIVE METABOLIC PANEL  ?I-STAT BETA HCG BLOOD, ED ([facility], [facility], [facility] ONLY)  ?TROPONIN I (HIGH SENSITIVITY)  ? ? ?EKG ?None ? ?Radiology ?DG Chest 2 View ? ?Result Date: 04/25/2021 ?CLINICAL DATA:  Chest pain EXAM: CHEST - 2 VIEW COMPARISON:  07/11/2019 FINDINGS: The heart size and mediastinal contours are within normal limits. Both lungs are clear. The visualized skeletal structures are unremarkable. IMPRESSION: No acute abnormality of the lungs. Electronically Signed   By: Jearld Lesch M.D.   On: 04/25/2021 10:49   ? ?Procedures ?Procedures  ? ? ?Medications Ordered in ED ?Medications - No data to display ? ?ED Course/ Medical Decision Making/ A&P ?  ?                        ?Medical Decision Making ?Patient is presenting for evaluation of chest pain, subacute without trauma or other concerning symptoms.  No history of same.  She has been using lidocaine recently for reflux symptoms at the direction of her gastroenterologist. ? ?Amount and/or Complexity of Data Reviewed ?External Data Reviewed: notes. ?   Details: Seen by her family medicine provider, on 04/05/2021 at that time advised to continue taking high-dose omeprazole 40 mg twice daily, Gaviscon and given lidocaine.  Last EGD was in October 2022. ?Labs: ordered. ?   Details: CBC, metabolic panel, pregnancy test, troponin- ?Radiology: ordered and independent interpretation performed. ?   Details: Chest x-ray, no infiltrate or edema ?ECG/medicine tests: ordered and independent interpretation performed. ?   Details:  Cardiac monitor-normal sinus rhythm ? ?Risk ?Prescription drug management. ?Risk Details: Patient presenting with nonspecific chest pain, EKG normal, screening testing normal.  Chest pain is atypical for cardiac disease she is very low risk for cardiac disorder causing chest pain.  No indication for further evaluation.  She does not require hospitalization ? ? ? ? ? ? ? ? ? ? ?Final Clinical Impression(s) / ED Diagnoses ?Final diagnoses:  ?Nonspecific chest pain  ? ? ?Rx / DC Orders ?ED Discharge Orders   ? ?  None  ? ?  ? ? ?  ?Mancel BaleWentz, Terina Mcelhinny, MD ?04/29/21 0820 ? ?

## 2021-04-25 NOTE — ED Triage Notes (Signed)
Patient coming from home, complaint of right-sided chest pain that has been going on for sometime. Pt endorses some right sided shoulder pain as well. Pt A&Ox4. NAD. ?

## 2021-04-25 NOTE — Discharge Instructions (Signed)
The testing today does not show any serious problems.  We are adding a new medicine to help in case this is esophagitis.  Stop taking the Xylocaine for now.  It is safe to go on your trip.  Follow-up with your primary care doctor or GI doctor as needed for problems. ?

## 2021-04-25 NOTE — ED Provider Triage Note (Signed)
Emergency Medicine Provider Triage Evaluation Note ? ?Vista Deck , a 46 y.o. female  was evaluated in triage.  Pt complains of right sided chest pain, right shoulder pain for the last three weeks on and off. She thinks it may have something to do with her acid reflux. Has been taking maalox / mylanta with some relief but pain always returns. Denies exertional pain, shob, NV, diaphoresis. No personal or 1st degree family hx of ACS. No hx of gallbladder issues, abdominal surgery. ? ?Review of Systems  ?Positive: Right sided chest pain, shoulder pain ?Negative: Shob, headache, fever, chills ? ?Physical Exam  ?BP (!) 147/118   Pulse 86   Temp 99.7 ?F (37.6 ?C)   Resp 18   SpO2 98%  ?Gen:   Awake, no distress   ?Resp:  Normal effort  ?MSK:   Moves extremities without difficulty  ?Other:  Negative murphy sign, no TTP chest wall or abdomen ? ?Medical Decision Making  ?Medically screening exam initiated at 10:33 AM.  Appropriate orders placed.  Mercie Eon Shed was informed that the remainder of the evaluation will be completed by another provider, this initial triage assessment does not replace that evaluation, and the importance of remaining in the ED until their evaluation is complete. ? ?Workup initiated ?  ?Olene Floss, PA-C ?04/25/21 1036 ? ?

## 2022-08-08 ENCOUNTER — Ambulatory Visit
Admission: EM | Admit: 2022-08-08 | Discharge: 2022-08-08 | Disposition: A | Discharge status: Home / Self Care | Payer: 59 | Attending: Emergency Medicine | Admitting: Emergency Medicine

## 2022-08-08 ENCOUNTER — Ambulatory Visit (INDEPENDENT_AMBULATORY_CARE_PROVIDER_SITE_OTHER): Payer: 59

## 2022-08-08 ENCOUNTER — Encounter: Payer: Self-pay | Admitting: Emergency Medicine

## 2022-08-08 DIAGNOSIS — R300 Dysuria: Secondary | ICD-10-CM

## 2022-08-08 DIAGNOSIS — U071 COVID-19: Secondary | ICD-10-CM

## 2022-08-08 LAB — URINALYSIS, W/ REFLEX TO CULTURE (INFECTION SUSPECTED)
Bilirubin Urine: NEGATIVE
Glucose, UA: NEGATIVE mg/dL
Ketones, ur: NEGATIVE mg/dL
Leukocytes,Ua: NEGATIVE
Nitrite: NEGATIVE
Protein, ur: NEGATIVE mg/dL
Specific Gravity, Urine: 1.025 (ref 1.005–1.030)
pH: 6.5 (ref 5.0–8.0)

## 2022-08-08 LAB — GROUP A STREP BY PCR: Group A Strep by PCR: NOT DETECTED

## 2022-08-08 LAB — SARS CORONAVIRUS 2 BY RT PCR: SARS Coronavirus 2 by RT PCR: POSITIVE — AB

## 2022-08-08 MED ORDER — NAPROXEN 500 MG PO TABS: 500.0000 mg | ORAL_TABLET | Freq: Two times a day (BID) | ORAL | 0 refills | Status: AC

## 2022-08-08 MED ORDER — ALBUTEROL SULFATE HFA 108 (90 BASE) MCG/ACT IN AERS: 1.0000 | INHALATION_SPRAY | RESPIRATORY_TRACT | 0 refills | Status: DC | PRN

## 2022-08-08 MED ORDER — FLUTICASONE PROPIONATE 50 MCG/ACT NA SUSP: 2.0000 | Freq: Every day | NASAL | 0 refills | Status: AC

## 2022-08-08 MED ORDER — AEROCHAMBER MV MISC: 1 refills | Status: AC

## 2022-08-08 MED ORDER — PROMETHAZINE-DM 6.25-15 MG/5ML PO SYRP: 5.0000 mL | ORAL_SOLUTION | Freq: Four times a day (QID) | ORAL | 0 refills | Status: DC | PRN

## 2022-08-08 NOTE — ED Triage Notes (Addendum)
Pt presents with a cough, sore throat, bodyaches, chills and congestion since yesterday. Pt also c/o dysuria that started today.

## 2022-08-08 NOTE — ED Provider Notes (Signed)
HPI  SUBJECTIVE:  Catherine Parsons is a 47 y.o. female who presents with 2 days of a dry cough, body aches, fevers Tmax 100.0, sore throat, nasal congestion, postnasal drip, shortness of breath and dyspnea on exertion.  She returned from [place] 3 days ago.  She also reports thoracic soreness present with coughing and sore, intermittent, seconds long low midline pelvic/abdominal pain that is present only with coughing, dysuria, urinary frequency.  She was unable to sleep at night because of the cough.  No headache, wheezing, nausea, vomiting, diarrhea, urinary urgency, cloudy odorous urine, hematuria, vaginal bleeding, odor, discharge, itch or irritation.  She is in a long-term monogamous relationship with a female, who is asymptomatic.  STDs are not a concern today.  No known COVID exposure.  She took Tylenol within 6 hours of evaluation with improvement in her symptoms.  Symptoms are worse with coughing.  She has a past medical history of hypertension, UTI, COVID and GERD.  No history of pyelonephritis, nephrolithiasis.  LMP: Late June.  Denies possibility being pregnant.  PCP: [facility].    Past Medical History:  Diagnosis Date   Hypertension     History reviewed. No pertinent surgical history.  Family History  Problem Relation Age of Onset   Hypertension Mother    Healthy Father     Social History   Tobacco Use   Smoking status: Never   Smokeless tobacco: Never  Vaping Use   Vaping Use: Never used  Substance Use Topics   Alcohol use: No   Drug use: No    No current facility-administered medications for this encounter.  Current Outpatient Medications:    albuterol (VENTOLIN HFA) 108 (90 Base) MCG/ACT inhaler, Inhale 1-2 puffs into the lungs every 4 (four) hours as needed for wheezing or shortness of breath., Disp: 1 each, Rfl: 0   fluticasone (FLONASE) 50 MCG/ACT nasal spray, Place 2 sprays into both nostrils daily., Disp: 16 g, Rfl: 0   naproxen (NAPROSYN) 500 MG tablet,  Take 1 tablet (500 mg total) by mouth 2 (two) times daily., Disp: 20 tablet, Rfl: 0   promethazine-dextromethorphan (PROMETHAZINE-DM) 6.25-15 MG/5ML syrup, Take 5 mLs by mouth 4 (four) times daily as needed for cough., Disp: 118 mL, Rfl: 0   Spacer/Aero-Holding Chambers (AEROCHAMBER MV) inhaler, Use as instructed, Disp: 1 each, Rfl: 1   acetaminophen (TYLENOL) 500 MG tablet, Take 500 mg by mouth 2 (two) times daily as needed (pain)., Disp: , Rfl:    amLODipine (NORVASC) 5 MG tablet, Take 5 mg by mouth daily., Disp: , Rfl:    lisinopril (ZESTRIL) 40 MG tablet, Take 40 mg by mouth daily., Disp: , Rfl:    metoprolol succinate (TOPROL-XL) 25 MG 24 hr tablet, Take 25 mg by mouth daily., Disp: , Rfl:    omeprazole (PRILOSEC) 40 MG capsule, TAKE 1 CAPSULE BY MOUTH EVERY DAY BEFORE DINNER (Patient taking differently: 40 mg 2 (two) times daily.), Disp: 30 capsule, Rfl: 1   sucralfate (CARAFATE) 1 g tablet, Take 1 tablet (1 g total) by mouth 4 (four) times daily -  with meals and at bedtime., Disp: 120 tablet, Rfl: 0   traZODone (DESYREL) 100 MG tablet, Take 50 mg by mouth at bedtime as needed for sleep., Disp: , Rfl:    tretinoin (RETIN-A) 0.025 % cream, Apply 1 application. topically at bedtime., Disp: , Rfl:   No Known Allergies   ROS  As noted in HPI.   Physical Exam  BP (!) 146/89 (BP Location:  Right Arm)   Pulse (!) 109   Temp 99.8 F (37.7 C) (Oral)   Resp 18   LMP  (LMP Unknown)   SpO2 96%   Constitutional: Well developed, well nourished, no acute distress Eyes:  EOMI, conjunctiva normal bilaterally HENT: Normocephalic, atraumatic,mucus membranes moist.  Erythematous, swollen turbinates.  Positive nasal congestion.  Mild maxillary sinus tenderness.  Erythematous oropharynx, tonsils normal without exudates.  Uvula midline. Neck: Positive cervical lymphadenopathy Respiratory: Normal inspiratory effort, lungs clear bilaterally.  Positive anterior and lateral chest wall  tenderness. Cardiovascular: Tachycardic, no murmurs, rubs, gallops. GI: nondistended, soft, active bowel sounds.  Diffuse abdominal tenderness maximal in the suprapubic region.  No flank tenderness. Back: No CVAT skin: No rash, skin intact Musculoskeletal: no deformities Neurologic: Alert & oriented x 3, no focal neuro deficits Psychiatric: Speech and behavior appropriate   ED Course   Medications - No data to display  Orders Placed This Encounter  Procedures   SARS Coronavirus 2 by RT PCR (hospital order, performed in [facility] hospital lab) *cepheid single result test* Anterior Nasal Swab    Standing Status:   Standing    Number of Occurrences:   1   Group A Strep by PCR    Standing Status:   Standing    Number of Occurrences:   1   DG Chest 2 View    Standing Status:   Standing    Number of Occurrences:   1    Order Specific Question:   Reason for Exam (SYMPTOM  OR DIAGNOSIS REQUIRED)    Answer:   cough SOB fever r/o PNA   Urinalysis, w/ Reflex to Culture (Infection Suspected) -Urine, Clean Catch    Standing Status:   Standing    Number of Occurrences:   1    Order Specific Question:   Specimen Source    Answer:   Urine, Clean Catch [76]    Results for orders placed or performed during the hospital encounter of 08/08/22 (from the past 24 hour(s))  SARS Coronavirus 2 by RT PCR (hospital order, performed in [facility] hospital lab) *cepheid single result test* Anterior Nasal Swab     Status: Abnormal   Collection Time: 08/08/22  1:35 PM   Specimen: Anterior Nasal Swab  Result Value Ref Range   SARS Coronavirus 2 by RT PCR POSITIVE (A) NEGATIVE  Group A Strep by PCR     Status: None   Collection Time: 08/08/22  1:35 PM   Specimen: Throat; Sterile Swab  Result Value Ref Range   Group A Strep by PCR NOT DETECTED NOT DETECTED  Urinalysis, w/ Reflex to Culture (Infection Suspected) -Urine, Clean Catch     Status: Abnormal   Collection Time: 08/08/22  1:36 PM  Result  Value Ref Range   Specimen Source URINE, CLEAN CATCH    Color, Urine YELLOW YELLOW   APPearance HAZY (A) CLEAR   Specific Gravity, Urine 1.025 1.005 - 1.030   pH 6.5 5.0 - 8.0   Glucose, UA NEGATIVE NEGATIVE mg/dL   Hgb urine dipstick TRACE (A) NEGATIVE   Bilirubin Urine NEGATIVE NEGATIVE   Ketones, ur NEGATIVE NEGATIVE mg/dL   Protein, ur NEGATIVE NEGATIVE mg/dL   Nitrite NEGATIVE NEGATIVE   Leukocytes,Ua NEGATIVE NEGATIVE   Squamous Epithelial / HPF 11-20 0 - 5 /HPF   WBC, UA 0-5 0 - 5 WBC/hpf   RBC / HPF 0-5 0 - 5 RBC/hpf   Bacteria, UA MANY (A) NONE SEEN   No results  found.  ED Clinical Impression  1. COVID-19 virus infection   2. Dysuria      ED Assessment/Plan   {The patient has not been seen in Urgent Care in the last 3 years. :1} Patient presents with an acute illness with systemic symptoms of tachycardia.  COVID-positive.  Strep negative.  She has trace hematuria and many bacteria in her urine, but no nitrates or esterase.  This is a contaminated sample.  Given that she has urinary complaints, will send off for culture prior to starting on antibiotics for UTI.  Reviewed imaging independently.  No pneumonia as read by me.  See radiology report for full details.  COVID-positive.  She does not appear to have a UTI.  Doubt acute intra-abdominal process.  X-ray negative for pneumonia per my read.  Will send home with regularly scheduled albuterol inhaler with a spacer, Naprosyn/Tylenol, Promethazine DM, Flonase, saline nasal irrigation.  She is not a candidate for antivirals.  I think that the risks outweigh the benefits as she is otherwise young and healthy.  Follow-up with PCP as needed.  ER return precautions given to patient and family member.  Discussed labs, imaging, MDM, treatment plan, and plan for follow-up with patient and family member.  Discussed sn/sx that should prompt return to the ED. they agree with plan.   Meds ordered this encounter  Medications    fluticasone (FLONASE) 50 MCG/ACT nasal spray    Sig: Place 2 sprays into both nostrils daily.    Dispense:  16 g    Refill:  0   promethazine-dextromethorphan (PROMETHAZINE-DM) 6.25-15 MG/5ML syrup    Sig: Take 5 mLs by mouth 4 (four) times daily as needed for cough.    Dispense:  118 mL    Refill:  0   naproxen (NAPROSYN) 500 MG tablet    Sig: Take 1 tablet (500 mg total) by mouth 2 (two) times daily.    Dispense:  20 tablet    Refill:  0   albuterol (VENTOLIN HFA) 108 (90 Base) MCG/ACT inhaler    Sig: Inhale 1-2 puffs into the lungs every 4 (four) hours as needed for wheezing or shortness of breath.    Dispense:  1 each    Refill:  0   Spacer/Aero-Holding Chambers (AEROCHAMBER MV) inhaler    Sig: Use as instructed    Dispense:  1 each    Refill:  1      *This clinic note was created using Dragon dictation software. Therefore, there may be occasional mistakes despite careful proofreading.  ?

## 2022-08-08 NOTE — Discharge Instructions (Addendum)
COVID is positive.  You must wear a mask for 5 days after the onset of symptoms.  May return to work when afebrile without the use of fever reducing medicine such as Tylenol or ibuprofen for 24 hours.  Your urinalysis is inconclusive for urinary tract infection.  I have sent it off for culture before starting you on antibiotics.  Take the Naprosyn with 1000 mg of Tylenol twice a day as needed for body aches, fevers.  Flonase, saline nasal irrigation with a Lloyd Huger Med rinse and distilled water as often as you want.  2 puffs from your albuterol inhaler using your spacer every 4 hours for 2 days, then every 6 hours for 2 days, then as needed.  You can back off on the albuterol if you start to improve sooner.  Promethazine DM as needed for cough.  The formal radiology read on your x-ray is still pending.  I did not see a pneumonia.  If you do have pneumonia, we will contact you.  However, I would not start antibiotics today because we know that this is COVID.  Follow-up with your primary care provider in several days.  Go to the ER if you get worse or for the signs and symptoms we discussed.

## 2022-08-21 IMAGING — CR DG CHEST 2V
2 series · 2 of 2 positions shown · non-contrast
Comparison: 07/11/2019

CLINICAL DATA: Chest pain

EXAM:
CHEST - 2 VIEW

[chest pa]
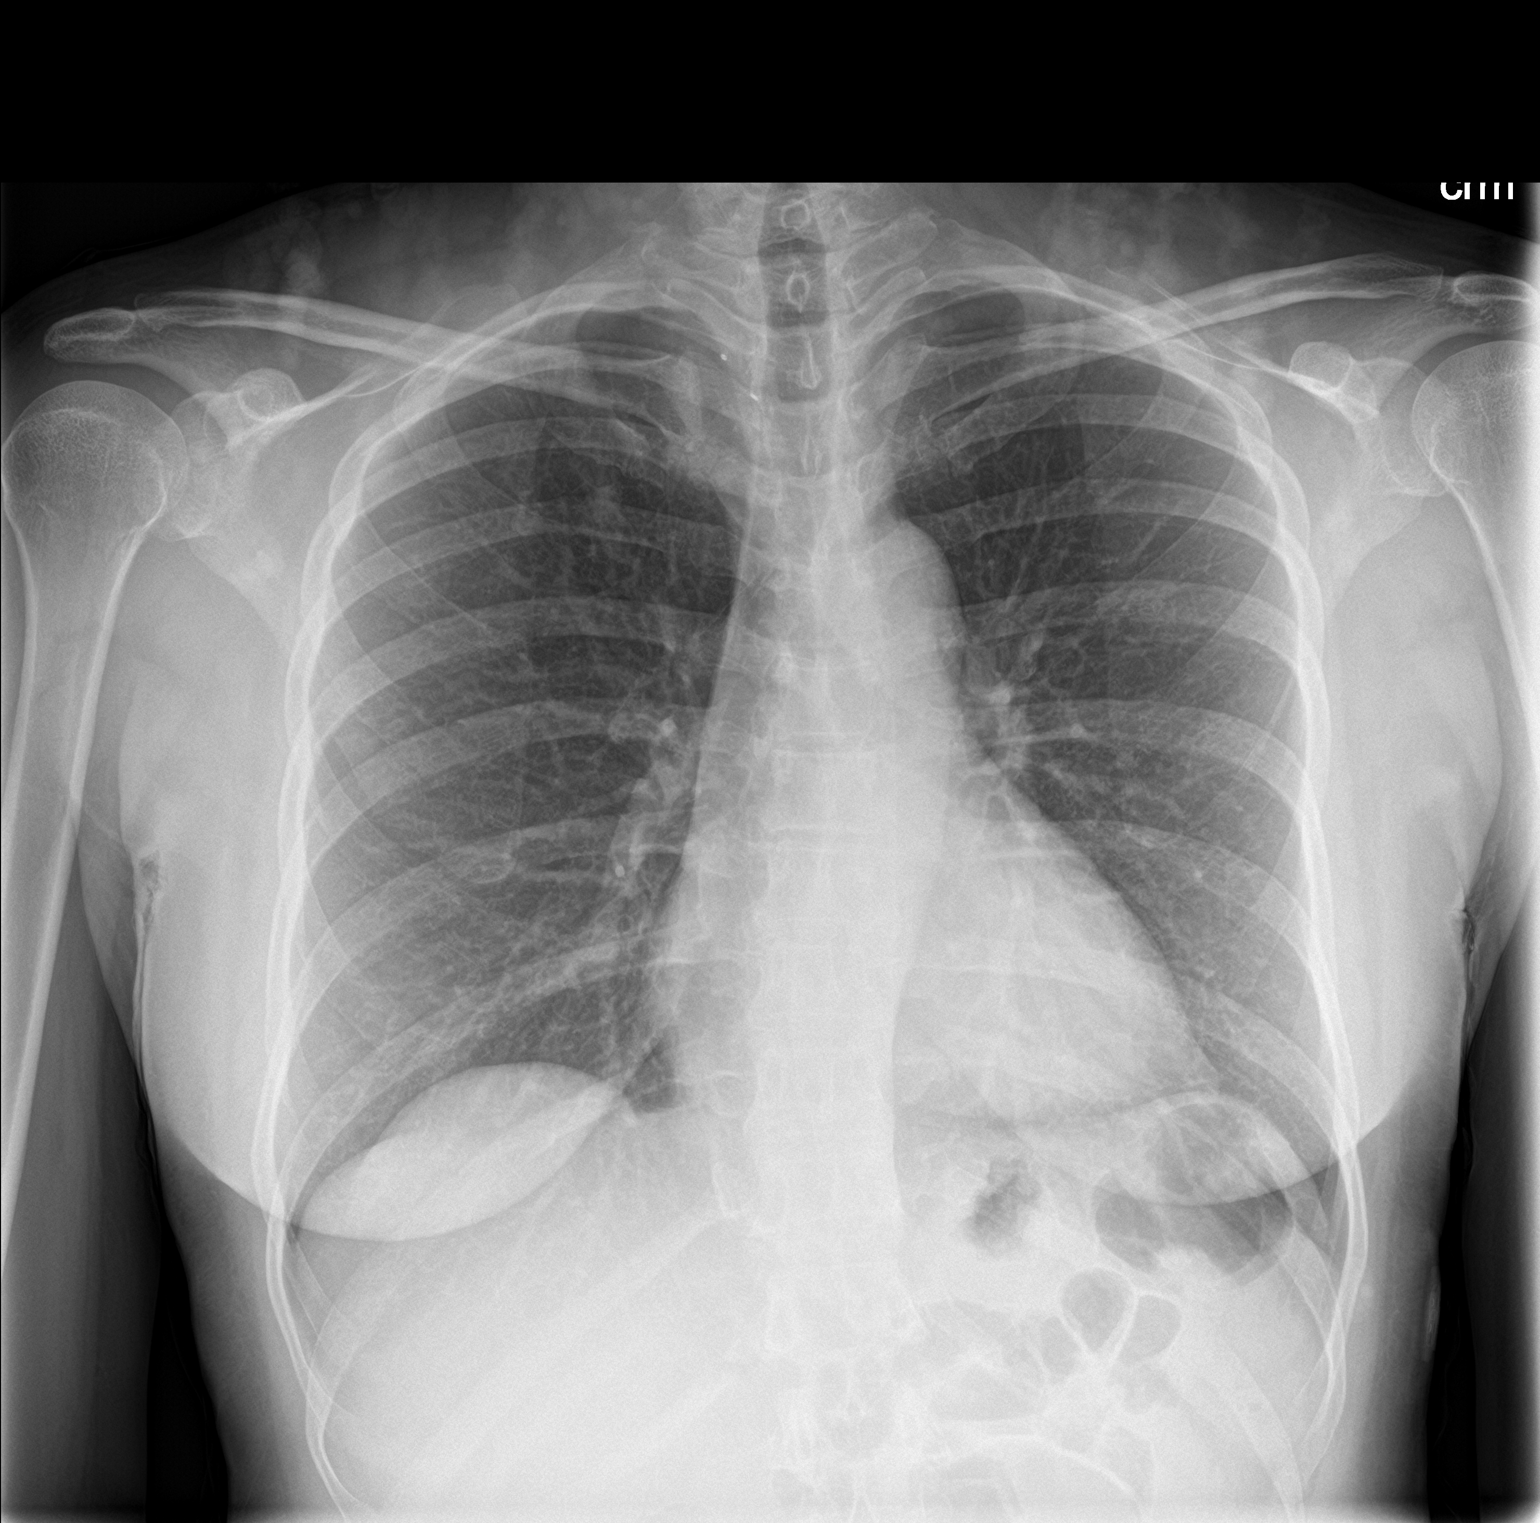

[chest lat]
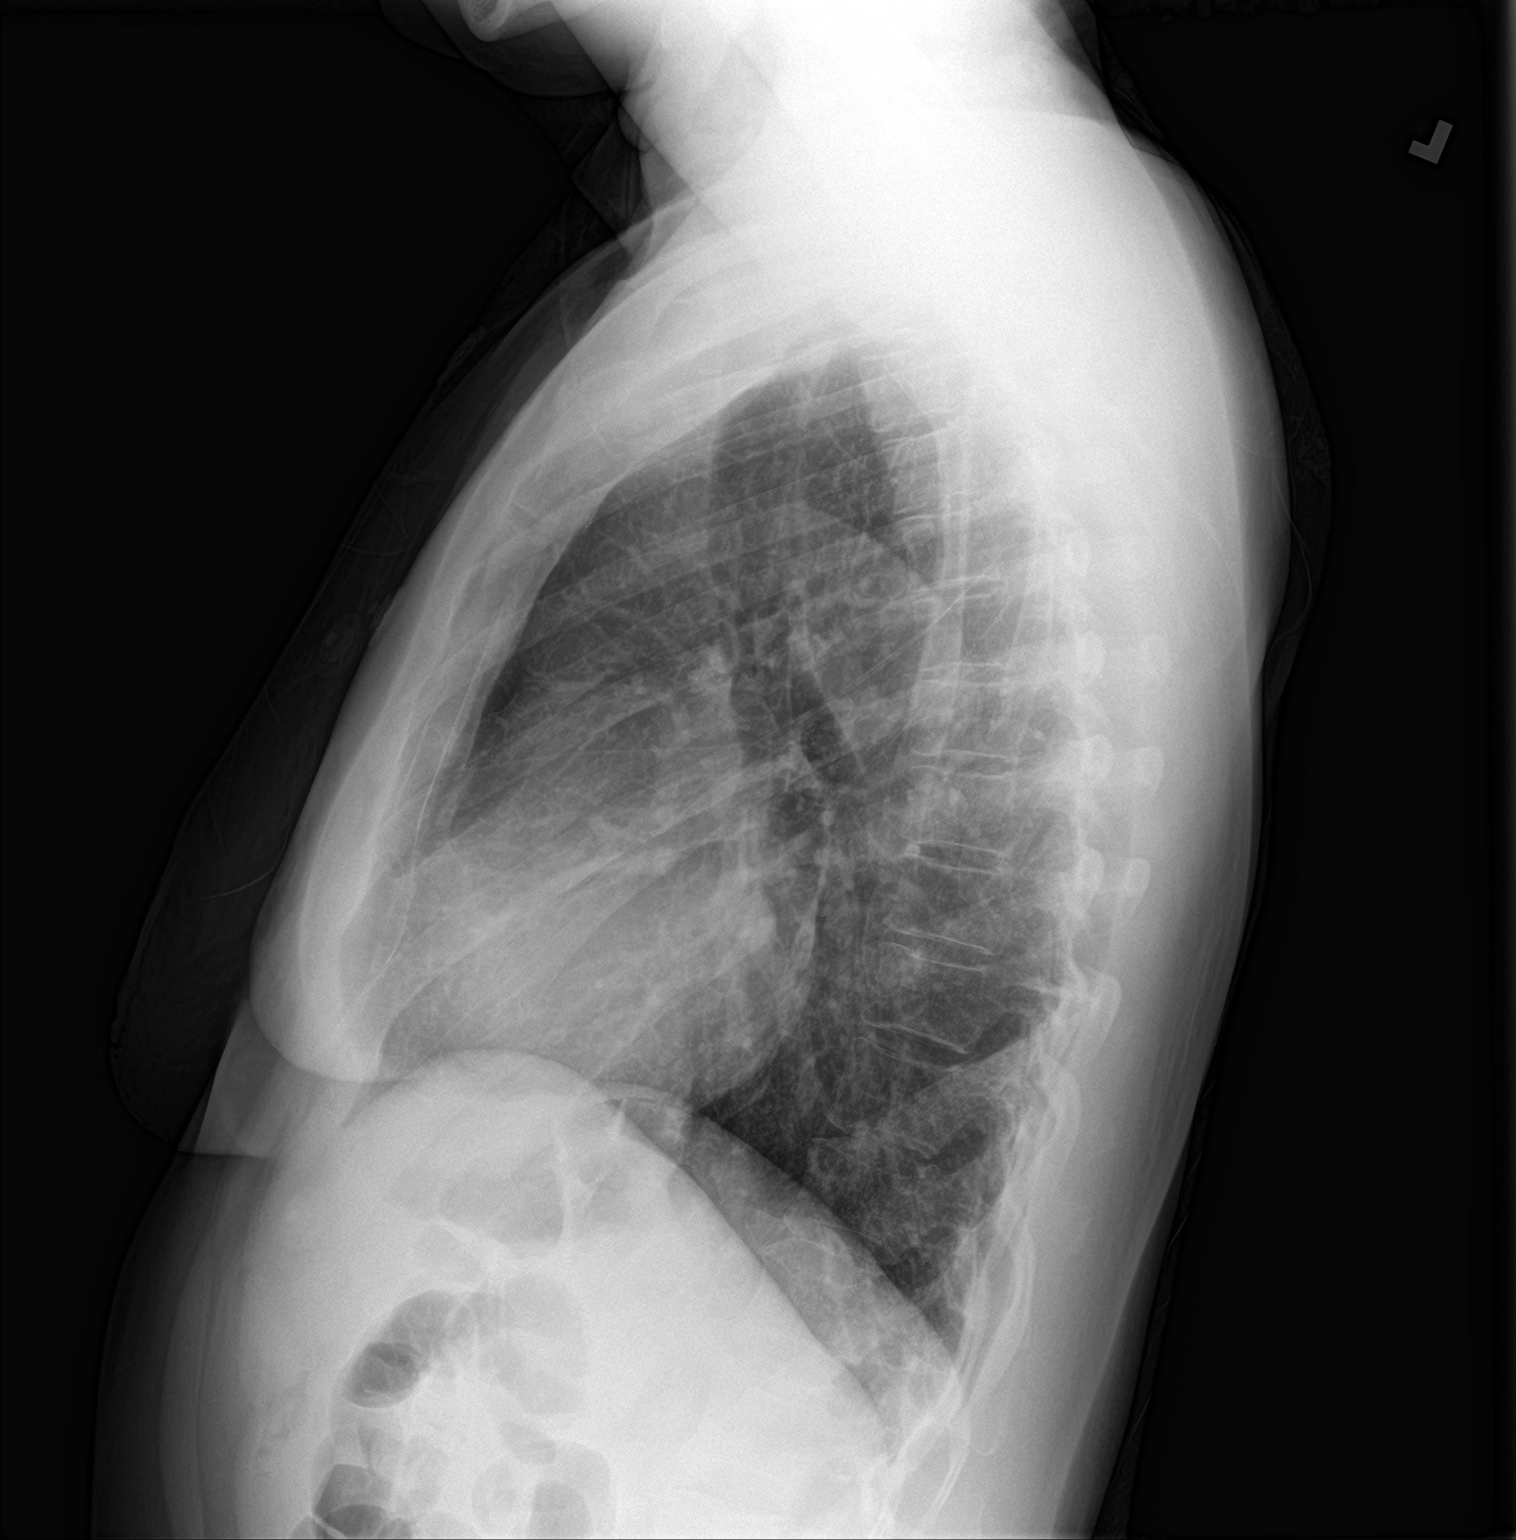

[2 of 2 positions shown; findings below may reference images not displayed]

FINDINGS: The heart size and mediastinal contours are within normal limits.
Both lungs are clear. The visualized skeletal structures are
unremarkable.
IMPRESSION: No acute abnormality of the lungs.

## 2023-07-31 ENCOUNTER — Ambulatory Visit
Admission: EM | Admit: 2023-07-31 | Discharge: 2023-07-31 | Disposition: A | Discharge status: Home / Self Care | Attending: Physician Assistant | Admitting: Physician Assistant

## 2023-07-31 ENCOUNTER — Encounter: Payer: Self-pay | Admitting: Emergency Medicine

## 2023-07-31 DIAGNOSIS — K0889 Other specified disorders of teeth and supporting structures: Secondary | ICD-10-CM

## 2023-07-31 MED ORDER — IBUPROFEN 600 MG PO TABS: 600.0000 mg | ORAL_TABLET | Freq: Four times a day (QID) | ORAL | 0 refills | Status: AC | PRN

## 2023-07-31 MED ORDER — AMOXICILLIN-POT CLAVULANATE 875-125 MG PO TABS: 1.0000 | ORAL_TABLET | Freq: Two times a day (BID) | ORAL | 0 refills | Status: AC

## 2023-07-31 NOTE — ED Triage Notes (Signed)
 Pt presents with left side dental pain x 1 week.

## 2023-07-31 NOTE — ED Provider Notes (Signed)
 [facility]    CSN: [id number] Arrival date & time: 07/31/23  1533      History   Chief Complaint Chief Complaint  Patient presents with   Dental Pain    HPI Catherine Parsons is a 48 y.o. female presenting for pain of the tooth of the left lower side for the past few days.  Patient states one of her fillings broke when she was chewing hard candy.  She contacted her dentist office but they are out of town all week for the holiday.  Goes to [employer].  Patient has been taking 200 mg ibuprofen  as needed for pain relief.  She denies fever, facial swelling or difficulty swallowing  HPI  Past Medical History:  Diagnosis Date   Hypertension     Patient Active Problem List   Diagnosis Date Noted   Hypercalcemia 11/11/2013   Low TSH level 11/11/2013    History reviewed. No pertinent surgical history.  OB History   No obstetric history on file.      Home Medications    Prior to Admission medications   Medication Sig Start Date End Date Taking? Authorizing Provider  amoxicillin-clavulanate (AUGMENTIN) 875-125 MG tablet Take 1 tablet by mouth every 12 (twelve) hours for 7 days. 07/31/23 08/07/23 Yes Arvis Jolan NOVAK, PA-C  ibuprofen  (ADVIL ) 600 MG tablet Take 1 tablet (600 mg total) by mouth every 6 (six) hours as needed. 07/31/23  Yes Arvis Jolan B, PA-C  acetaminophen  (TYLENOL ) 500 MG tablet Take 500 mg by mouth 2 (two) times daily as needed (pain).    [provider]  albuterol  (VENTOLIN  HFA) 108 (90 Base) MCG/ACT inhaler Inhale 1-2 puffs into the lungs every 4 (four) hours as needed for wheezing or shortness of breath. 08/08/22   Van Knee, MD  amLODipine  (NORVASC ) 5 MG tablet Take 5 mg by mouth daily. 04/19/19   [provider]  fluticasone  (FLONASE ) 50 MCG/ACT nasal spray Place 2 sprays into both nostrils daily. 08/08/22   Mortenson, Ashley, MD  lisinopril (ZESTRIL) 40 MG tablet Take 40 mg by mouth daily. 06/25/19   [provider]   metoprolol succinate (TOPROL-XL) 25 MG 24 hr tablet Take 25 mg by mouth daily. 04/20/19   [provider]  naproxen  (NAPROSYN ) 500 MG tablet Take 1 tablet (500 mg total) by mouth 2 (two) times daily. 08/08/22   Van Knee, MD  omeprazole  (PRILOSEC) 40 MG capsule TAKE 1 CAPSULE BY MOUTH EVERY DAY BEFORE DINNER Patient taking differently: 40 mg 2 (two) times daily. 04/30/20   Ethyl Lonni BRAVO, MD  promethazine -dextromethorphan (PROMETHAZINE -DM) 6.25-15 MG/5ML syrup Take 5 mLs by mouth 4 (four) times daily as needed for cough. 08/08/22   Van Knee, MD  Spacer/Aero-Holding Chambers (AEROCHAMBER MV) inhaler Use as instructed 08/08/22   Van Knee, MD  sucralfate  (CARAFATE ) 1 g tablet Take 1 tablet (1 g total) by mouth 4 (four) times daily -  with meals and at bedtime. 04/25/21   Lorriane Holmes, MD  traZODone (DESYREL) 100 MG tablet Take 50 mg by mouth at bedtime as needed for sleep. 06/27/19   [provider]  tretinoin (RETIN-A) 0.025 % cream Apply 1 application. topically at bedtime. 01/17/21   [provider]    Family History Family History  Problem Relation Age of Onset   Hypertension Mother    Healthy Father     Social History Social History   Tobacco Use   Smoking status: Never   Smokeless tobacco: Never  Vaping  Use   Vaping status: Never Used  Substance Use Topics   Alcohol use: No   Drug use: No     Allergies   Patient has no known allergies.   Review of Systems Review of Systems  Constitutional:  Negative for fatigue and fever.  HENT:  Positive for dental problem. Negative for facial swelling, sore throat and trouble swallowing.   Hematological:  Negative for adenopathy.     Physical Exam Triage Vital Signs ED Triage Vitals  Encounter Vitals Group     BP 07/31/23 1558 (!) 145/91     Girls Systolic BP Percentile --      Girls Diastolic BP Percentile --      Boys Systolic BP Percentile --      Boys Diastolic BP  Percentile --      Pulse Rate 07/31/23 1558 73     Resp 07/31/23 1558 16     Temp 07/31/23 1558 98.3 F (36.8 C)     Temp Source 07/31/23 1558 Oral     SpO2 07/31/23 1558 98 %     Weight --      Height --      Head Circumference --      Peak Flow --      Pain Score 07/31/23 1557 6     Pain Loc --      Pain Education --      Exclude from Growth Chart --    No data found.  Updated Vital Signs BP (!) 145/91 (BP Location: Right Arm)   Pulse 73   Temp 98.3 F (36.8 C) (Oral)   Resp 16   LMP 07/27/2023   SpO2 98%      Physical Exam Vitals and nursing note reviewed.  Constitutional:      General: She is not in acute distress.    Appearance: Normal appearance. She is not ill-appearing or toxic-appearing.  HENT:     Head: Normocephalic and atraumatic.     Mouth/Throat:     Mouth: Mucous membranes are moist.     Pharynx: Oropharynx is clear.      Comments: Tooth indicated in picture is tender when touched with mild swelling  Eyes:     General: No scleral icterus.       Right eye: No discharge.        Left eye: No discharge.     Conjunctiva/sclera: Conjunctivae normal.    Cardiovascular:     Rate and Rhythm: Normal rate.  Pulmonary:     Effort: Pulmonary effort is normal. No respiratory distress.   Musculoskeletal:     Cervical back: Neck supple.   Skin:    General: Skin is dry.   Neurological:     General: No focal deficit present.     Mental Status: She is alert. Mental status is at baseline.     Motor: No weakness.     Gait: Gait normal.   Psychiatric:        Mood and Affect: Mood normal.        Behavior: Behavior normal.      UC Treatments / Results  Labs (all labs ordered are listed, but only abnormal results are displayed) Labs Reviewed - No data to display  EKG   Radiology No results found.  Procedures Procedures (including critical care time)  Medications Ordered in UC Medications - No data to display  Initial Impression /  Assessment and Plan / UC Course  I have reviewed the triage vital  signs and the nursing notes.  Pertinent labs & imaging results that were available during my care of the patient were reviewed by me and considered in my medical decision making (see chart for details).   48 year old female presents for pain of the tooth of the left lower side for the past few days.  Symptoms started after she was chewing hard candy and broke a filling.  Presentation today may represent a early dental infection and/or dental injury although I do not see any cracks or holes in the tooth.  There is some surrounding swelling of the tooth that is tender to palpation.  Will cover at this time with Augmentin.  Sent ibuprofen  to pharmacy as needed for pain relief and encouraged her to follow-up with dentist as soon as she is able to.   Final Clinical Impressions(s) / UC Diagnoses   Final diagnoses:  Pain, dental     Discharge Instructions      - Follow-up with dentist as soon as possible for recheck. - Start the antibiotics and anti-inflammatory/pain medicine as needed at this time.  Counseled take Tylenol .  May use Orajel.     ED Prescriptions     Medication Sig Dispense Auth. Provider   ibuprofen  (ADVIL ) 600 MG tablet Take 1 tablet (600 mg total) by mouth every 6 (six) hours as needed. 30 tablet Arvis Huxley B, PA-C   amoxicillin-clavulanate (AUGMENTIN) 875-125 MG tablet Take 1 tablet by mouth every 12 (twelve) hours for 7 days. 14 tablet Varsha Knock B, PA-C      PDMP not reviewed this encounter.   Arvis Huxley NOVAK, PA-C 07/31/23 1622

## 2023-07-31 NOTE — Discharge Instructions (Addendum)
-   Follow-up with dentist as soon as possible for recheck. - Start the antibiotics and anti-inflammatory/pain medicine as needed at this time.  Counseled take Tylenol .  May use Orajel.

## 2024-01-29 ENCOUNTER — Ambulatory Visit: Admission: EM | Admit: 2024-01-29 | Discharge: 2024-01-29 | Disposition: A | Discharge status: Home / Self Care

## 2024-01-29 ENCOUNTER — Encounter: Payer: Self-pay | Admitting: Emergency Medicine

## 2024-01-29 ENCOUNTER — Ambulatory Visit

## 2024-01-29 ENCOUNTER — Ambulatory Visit: Payer: Self-pay | Admitting: Physician Assistant

## 2024-01-29 DIAGNOSIS — R509 Fever, unspecified: Secondary | ICD-10-CM

## 2024-01-29 DIAGNOSIS — R000 Tachycardia, unspecified: Secondary | ICD-10-CM | POA: Diagnosis not present

## 2024-01-29 DIAGNOSIS — R300 Dysuria: Secondary | ICD-10-CM | POA: Diagnosis not present

## 2024-01-29 DIAGNOSIS — R051 Acute cough: Secondary | ICD-10-CM | POA: Diagnosis not present

## 2024-01-29 DIAGNOSIS — R0602 Shortness of breath: Secondary | ICD-10-CM

## 2024-01-29 DIAGNOSIS — R059 Cough, unspecified: Secondary | ICD-10-CM | POA: Diagnosis present

## 2024-01-29 DIAGNOSIS — J111 Influenza due to unidentified influenza virus with other respiratory manifestations: Secondary | ICD-10-CM | POA: Insufficient documentation

## 2024-01-29 DIAGNOSIS — I10 Essential (primary) hypertension: Secondary | ICD-10-CM | POA: Insufficient documentation

## 2024-01-29 LAB — POCT URINE DIPSTICK
Bilirubin, UA: NEGATIVE
Blood, UA: NEGATIVE
Glucose, UA: NEGATIVE mg/dL
Ketones, POC UA: NEGATIVE mg/dL
Nitrite, UA: NEGATIVE
Protein Ur, POC: 30 mg/dL — AB
Spec Grav, UA: 1.02
Urobilinogen, UA: 0.2 U/dL
pH, UA: 6.5

## 2024-01-29 LAB — POCT INFLUENZA A/B
Influenza A, POC: NEGATIVE
Influenza B, POC: NEGATIVE

## 2024-01-29 LAB — POC SOFIA SARS ANTIGEN FIA: SARS Coronavirus 2 Ag: NEGATIVE

## 2024-01-29 MED ORDER — ACETAMINOPHEN 160 MG/5ML PO SOLN
650.0000 mg | Freq: Once | ORAL | Status: AC
  Administered 2024-01-29: 650 mg via ORAL

## 2024-01-29 MED ORDER — OSELTAMIVIR PHOSPHATE 75 MG PO CAPS: 75.0000 mg | ORAL_CAPSULE | Freq: Two times a day (BID) | ORAL | 0 refills | Status: AC

## 2024-01-29 MED ORDER — ALBUTEROL SULFATE HFA 108 (90 BASE) MCG/ACT IN AERS: 1.0000 | INHALATION_SPRAY | Freq: Four times a day (QID) | RESPIRATORY_TRACT | 0 refills | Status: AC | PRN

## 2024-01-29 MED ORDER — PROMETHAZINE-DM 6.25-15 MG/5ML PO SYRP: 5.0000 mL | ORAL_SOLUTION | Freq: Four times a day (QID) | ORAL | 0 refills | Status: AC | PRN

## 2024-01-29 NOTE — ED Provider Notes (Signed)
 " [facility]    CSN: [id number] Arrival date & time: 01/29/24  9160      History   Chief Complaint Chief Complaint  Patient presents with   Cough   Generalized Body Aches   Headache   Fever    HPI Catherine Parsons is a 48 y.o. female presenting for fever, fatigue, cough, congestion, headaches, and body aches x 2 days. Denies ear pain, sore throat, sinus pain, chest pain, shortness of breath, abdominal pain, vomiting or diarrhea.  Ports hearing wheezing when she breathes.  States when she had COVID she needed to use an inhaler and has been using that inhaler recently.  She says it helps her breathe better.  She would like a refill of the inhaler.  Patient has been taking over-the-counter cough medicine. No other complaints.   HPI  Past Medical History:  Diagnosis Date   Hypertension     Patient Active Problem List   Diagnosis Date Noted   Hypercalcemia 11/11/2013   Low TSH level 11/11/2013    History reviewed. No pertinent surgical history.  OB History   No obstetric history on file.      Home Medications    Prior to Admission medications  Medication Sig Start Date End Date Taking? Authorizing Provider  albuterol  (VENTOLIN  HFA) 108 (90 Base) MCG/ACT inhaler Inhale 1-2 puffs into the lungs every 6 (six) hours as needed for wheezing or shortness of breath. 01/29/24  Yes Arvis Huxley B, PA-C  atorvastatin (LIPITOR) 20 MG tablet Take 20 mg by mouth daily. 09/09/23 10/13/24 Yes [provider]  metFORMIN (GLUCOPHAGE) 500 MG tablet Take 500 mg by mouth. 09/09/23 10/13/24 Yes [provider]  oseltamivir  (TAMIFLU ) 75 MG capsule Take 1 capsule (75 mg total) by mouth every 12 (twelve) hours for 5 days. 01/29/24 02/03/24 Yes Arvis Huxley NOVAK, PA-C  promethazine -dextromethorphan (PROMETHAZINE -DM) 6.25-15 MG/5ML syrup Take 5 mLs by mouth 4 (four) times daily as needed. 01/29/24  Yes Arvis Huxley B, PA-C  acetaminophen  (TYLENOL ) 500 MG tablet Take 500 mg by  mouth 2 (two) times daily as needed (pain).    [provider]  amLODipine  (NORVASC ) 5 MG tablet Take 5 mg by mouth daily. 04/19/19   [provider]  fluticasone  (FLONASE ) 50 MCG/ACT nasal spray Place 2 sprays into both nostrils daily. 08/08/22   Van Knee, MD  ibuprofen  (ADVIL ) 600 MG tablet Take 1 tablet (600 mg total) by mouth every 6 (six) hours as needed. 07/31/23   Arvis Huxley B, PA-C  lisinopril (ZESTRIL) 40 MG tablet Take 40 mg by mouth daily. 06/25/19   [provider]  metoprolol succinate (TOPROL-XL) 25 MG 24 hr tablet Take 25 mg by mouth daily. 04/20/19   [provider]  naproxen  (NAPROSYN ) 500 MG tablet Take 1 tablet (500 mg total) by mouth 2 (two) times daily. 08/08/22   Van Knee, MD  omeprazole  (PRILOSEC) 40 MG capsule TAKE 1 CAPSULE BY MOUTH EVERY DAY BEFORE DINNER Patient taking differently: 40 mg 2 (two) times daily. 04/30/20   Ethyl Lonni BRAVO, MD  Spacer/Aero-Holding Chambers (AEROCHAMBER MV) inhaler Use as instructed 08/08/22   Van Knee, MD  sucralfate  (CARAFATE ) 1 g tablet Take 1 tablet (1 g total) by mouth 4 (four) times daily -  with meals and at bedtime. 04/25/21   Lorriane Holmes, MD  traZODone (DESYREL) 100 MG tablet Take 50 mg by mouth at bedtime as needed for sleep. 06/27/19   [provider]  tretinoin (RETIN-A) 0.025 %  cream Apply 1 application. topically at bedtime. 01/17/21   [provider]    Family History Family History  Problem Relation Age of Onset   Hypertension Mother    Healthy Father     Social History Social History[1]   Allergies   Patient has no known allergies.   Review of Systems Review of Systems  Constitutional:  Positive for fatigue and fever.  HENT:  Positive for congestion and rhinorrhea. Negative for ear pain, sinus pain and sore throat.   Respiratory:  Positive for cough and wheezing. Negative for shortness of breath.   Cardiovascular:  Negative for chest  pain.  Gastrointestinal:  Negative for abdominal pain, nausea and vomiting.  Musculoskeletal:  Positive for myalgias.  Skin:  Negative for rash.  Neurological:  Positive for headaches. Negative for weakness.  Hematological:  Negative for adenopathy.     Physical Exam Triage Vital Signs ED Triage Vitals  Encounter Vitals Group     BP      Girls Systolic BP Percentile      Girls Diastolic BP Percentile      Boys Systolic BP Percentile      Boys Diastolic BP Percentile      Pulse      Resp      Temp      Temp src      SpO2      Weight      Height      Head Circumference      Peak Flow      Pain Score      Pain Loc      Pain Education      Exclude from Growth Chart    No data found.  Updated Vital Signs BP 131/85 (BP Location: Right Arm)   Pulse (!) 114   Temp (!) 100.6 F (38.1 C) (Oral)   Resp 18   Wt 172 lb (78 kg)   SpO2 95%   BMI 28.62 kg/m    Physical Exam Vitals and nursing note reviewed.  Constitutional:      General: She is not in acute distress.    Appearance: Normal appearance. She is ill-appearing. She is not toxic-appearing.  HENT:     Head: Normocephalic and atraumatic.     Nose: Congestion present.     Mouth/Throat:     Mouth: Mucous membranes are moist.     Pharynx: Oropharynx is clear.  Eyes:     General: No scleral icterus.       Right eye: No discharge.        Left eye: No discharge.     Conjunctiva/sclera: Conjunctivae normal.  Cardiovascular:     Rate and Rhythm: Regular rhythm. Tachycardia present.     Heart sounds: Normal heart sounds.  Pulmonary:     Effort: Pulmonary effort is normal. No respiratory distress.     Breath sounds: Normal breath sounds.     Comments: Lungs are clear but reduced airflow throughout Musculoskeletal:     Cervical back: Neck supple.  Skin:    General: Skin is dry.  Neurological:     General: No focal deficit present.     Mental Status: She is alert. Mental status is at baseline.     Motor: No  weakness.     Gait: Gait normal.  Psychiatric:        Mood and Affect: Mood normal.        Behavior: Behavior normal.      UC Treatments /  Results  Labs (all labs ordered are listed, but only abnormal results are displayed) Labs Reviewed  POCT URINE DIPSTICK - Abnormal; Notable for the following components:      Result Value   Protein Ur, POC =30 (*)    Leukocytes, UA Trace (*)    All other components within normal limits  POCT INFLUENZA A/B - Normal  POC SOFIA SARS ANTIGEN FIA - Normal  URINE CULTURE    EKG   Radiology DG Chest 2 View Result Date: 01/29/2024 CLINICAL DATA:  Cough and fever for several days EXAM: CHEST - 2 VIEW COMPARISON:  August 08, 2022 FINDINGS: The heart size and mediastinal contours are within normal limits. Both lungs are clear. The visualized skeletal structures are unremarkable. IMPRESSION: No active cardiopulmonary disease. Electronically Signed   By: Lynwood Landy Raddle M.D.   On: 01/29/2024 11:49    Procedures Procedures (including critical care time)  Medications Ordered in UC Medications  acetaminophen  (TYLENOL ) 160 MG/5ML solution 650 mg (650 mg Oral Given 01/29/24 0939)    Initial Impression / Assessment and Plan / UC Course  I have reviewed the triage vital signs and the nursing notes.  Pertinent labs & imaging results that were available during my care of the patient were reviewed by me and considered in my medical decision making (see chart for details).  48 year old female presents for fever, fatigue, cough, congestion, headaches, body aches and wheezing since yesterday.  Recent travel from [place] .  Patient denies any known COVID or flu exposure.  Vitals reveal tachycardia 122 bpm, elevated blood pressure 164/90 and temp 102 degrees.  She is ill-appearing but nontoxic.  On exam has nasal congestion.  Throat clear.  Chest clear but reduced airflow throughout.  Heart regular rhythm.  COVID and flu testing obtained. Both  negative.  Patient given 650 mg oral Tylenol .  Patient now reporting lower back pain and a tinge with urination since yesterday. Requesting to have urine checked for UTI.  UA shows trace leuks. Will send for culture.   Will obtain chest x-ray to assess for possible pneumonia.   Reviewed x-ray and it.  Will wait for official results and treat with antibiotics if pneumonia shown.  If negative chest x-ray will treat with Tamiflu  for suspected flu given prevalence at this time.  X-ray negative for pneumonia.  Patient notified.  Will treat for suspected influenza with Tamiflu .  Also sent Promethazine  DM and ProAir .  Encouraged increasing rest and fluids.  Will wait for urine culture and if positive treat with antibiotics.  Acute illness with systemic symptoms.   Final Clinical Impressions(s) / UC Diagnoses   Final diagnoses:  Dysuria  Fever, unspecified  Febrile illness  Acute cough  Shortness of breath  Influenza-like illness     Discharge Instructions      - Negative flu and COVID test. - Pending results on the chest x-ray.  I will call you when they return. - If you have pneumonia we will treat you with antibiotics but if your chest x-ray is normal I will send Tamiflu  for possible false negative flu test given the prevalence in the community. - I sent call medication. - Increase rest and fluids and take Tylenol  and/or Motrin  as needed for fever. - Urine test shows trace white blood cells.  This based on your symptoms does not obviously show UTI so we are culturing it.  If positive bacteria on the culture we will call and treat with antibiotics. - If at any point you  are feeling worse--unable to break fever, worsening fatigue/weakness, increased chest pain or breathing problem please go immediately to the ER. - You should isolate until fever free 24 hours and feeling better.     ED Prescriptions     Medication Sig Dispense Auth. Provider   promethazine -dextromethorphan  (PROMETHAZINE -DM) 6.25-15 MG/5ML syrup Take 5 mLs by mouth 4 (four) times daily as needed. 118 mL Arvis Huxley B, PA-C   albuterol  (VENTOLIN  HFA) 108 (90 Base) MCG/ACT inhaler Inhale 1-2 puffs into the lungs every 6 (six) hours as needed for wheezing or shortness of breath. 1 each Arvis Huxley NOVAK, PA-C   oseltamivir  (TAMIFLU ) 75 MG capsule Take 1 capsule (75 mg total) by mouth every 12 (twelve) hours for 5 days. 10 capsule Arvis Huxley NOVAK, PA-C      PDMP not reviewed this encounter.     [1]  Social History Tobacco Use   Smoking status: Never   Smokeless tobacco: Never  Vaping Use   Vaping status: Never Used  Substance Use Topics   Alcohol use: No   Drug use: No     Arvis Huxley NOVAK, PA-C 01/29/24 1205  "

## 2024-01-29 NOTE — ED Triage Notes (Signed)
 Pt c.o fever, headache, cough and bodyaches since yesterday. Pt has taken OTC cold medication.

## 2024-01-29 NOTE — Discharge Instructions (Addendum)
-   Negative flu and COVID test. - Pending results on the chest x-ray.  I will call you when they return. - If you have pneumonia we will treat you with antibiotics but if your chest x-ray is normal I will send Tamiflu  for possible false negative flu test given the prevalence in the community. - I sent call medication. - Increase rest and fluids and take Tylenol  and/or Motrin  as needed for fever. - Urine test shows trace white blood cells.  This based on your symptoms does not obviously show UTI so we are culturing it.  If positive bacteria on the culture we will call and treat with antibiotics. - If at any point you are feeling worse--unable to break fever, worsening fatigue/weakness, increased chest pain or breathing problem please go immediately to the ER. - You should isolate until fever free 24 hours and feeling better.

## 2024-01-30 LAB — URINE CULTURE: Culture: <10000 — AB
